# Patient Record
Sex: Female | Born: 1985 | Race: White | Hispanic: No | Marital: Single | Smoking: Current every day smoker
Health system: Southern US, Community
[De-identification: ages and names within clinical notes are randomized; demographics above are authoritative.]

## PROBLEM LIST (undated history)

## (undated) DIAGNOSIS — F209 Schizophrenia, unspecified: Secondary | ICD-10-CM

## (undated) DIAGNOSIS — G43909 Migraine, unspecified, not intractable, without status migrainosus: Secondary | ICD-10-CM

## (undated) DIAGNOSIS — R0602 Shortness of breath: Secondary | ICD-10-CM

## (undated) DIAGNOSIS — F329 Major depressive disorder, single episode, unspecified: Secondary | ICD-10-CM

## (undated) DIAGNOSIS — F32A Depression, unspecified: Secondary | ICD-10-CM

## (undated) DIAGNOSIS — F319 Bipolar disorder, unspecified: Secondary | ICD-10-CM

## (undated) DIAGNOSIS — R51 Headache: Secondary | ICD-10-CM

## (undated) DIAGNOSIS — Z5189 Encounter for other specified aftercare: Secondary | ICD-10-CM

## (undated) DIAGNOSIS — J45909 Unspecified asthma, uncomplicated: Secondary | ICD-10-CM

## (undated) HISTORY — PX: TUBAL LIGATION: SHX77

---
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
## 2008-09-01 ENCOUNTER — Inpatient Hospital Stay (HOSPITAL_COMMUNITY): Admission: AD | Admit: 2008-09-01 | Discharge: 2008-09-01 | Payer: Self-pay | Admitting: Family Medicine

## 2008-09-03 ENCOUNTER — Inpatient Hospital Stay (HOSPITAL_COMMUNITY): Admission: AD | Admit: 2008-09-03 | Discharge: 2008-09-03 | Payer: Self-pay | Admitting: Obstetrics & Gynecology

## 2009-01-26 ENCOUNTER — Inpatient Hospital Stay (HOSPITAL_COMMUNITY): Admission: AD | Admit: 2009-01-26 | Discharge: 2009-01-26 | Payer: Self-pay | Admitting: Obstetrics and Gynecology

## 2009-01-29 ENCOUNTER — Inpatient Hospital Stay (HOSPITAL_COMMUNITY): Admission: AD | Admit: 2009-01-29 | Discharge: 2009-01-29 | Payer: Self-pay | Admitting: Obstetrics and Gynecology

## 2009-02-10 ENCOUNTER — Inpatient Hospital Stay (HOSPITAL_COMMUNITY): Admission: AD | Admit: 2009-02-10 | Discharge: 2009-02-10 | Payer: Self-pay | Admitting: Obstetrics and Gynecology

## 2009-02-21 ENCOUNTER — Ambulatory Visit: Payer: Self-pay | Admitting: Internal Medicine

## 2009-02-21 ENCOUNTER — Inpatient Hospital Stay (HOSPITAL_COMMUNITY): Admission: AD | Admit: 2009-02-21 | Discharge: 2009-02-24 | Payer: Self-pay | Admitting: Obstetrics and Gynecology

## 2009-02-22 ENCOUNTER — Encounter: Payer: Self-pay | Admitting: Internal Medicine

## 2009-03-06 ENCOUNTER — Inpatient Hospital Stay (HOSPITAL_COMMUNITY): Admission: AD | Admit: 2009-03-06 | Discharge: 2009-03-06 | Payer: Self-pay | Admitting: Obstetrics and Gynecology

## 2009-03-08 ENCOUNTER — Inpatient Hospital Stay (HOSPITAL_COMMUNITY): Admission: AD | Admit: 2009-03-08 | Discharge: 2009-03-08 | Payer: Self-pay | Admitting: Obstetrics and Gynecology

## 2009-03-12 ENCOUNTER — Inpatient Hospital Stay (HOSPITAL_COMMUNITY): Admission: AD | Admit: 2009-03-12 | Discharge: 2009-03-12 | Payer: Self-pay | Admitting: Obstetrics and Gynecology

## 2009-03-17 ENCOUNTER — Inpatient Hospital Stay (HOSPITAL_COMMUNITY): Admission: AD | Admit: 2009-03-17 | Discharge: 2009-03-17 | Payer: Self-pay | Admitting: Obstetrics and Gynecology

## 2009-03-21 ENCOUNTER — Inpatient Hospital Stay (HOSPITAL_COMMUNITY): Admission: AD | Admit: 2009-03-21 | Discharge: 2009-03-22 | Payer: Self-pay | Admitting: Obstetrics and Gynecology

## 2009-03-24 ENCOUNTER — Inpatient Hospital Stay (HOSPITAL_COMMUNITY): Admission: AD | Admit: 2009-03-24 | Discharge: 2009-03-29 | Payer: Self-pay | Admitting: Obstetrics and Gynecology

## 2009-03-28 ENCOUNTER — Encounter (INDEPENDENT_AMBULATORY_CARE_PROVIDER_SITE_OTHER): Payer: Self-pay | Admitting: Obstetrics and Gynecology

## 2009-11-06 IMAGING — CR DG CHEST 1V PORT
1 series · 1 of 1 positions shown · non-contrast
Comparison: Portable exam 6566 hours without priors for comparison.

CLINICAL DATA: 37 weeks pregnant, chest pain, shortness of breath

PORTABLE CHEST - 1 VIEW

[view not recorded]
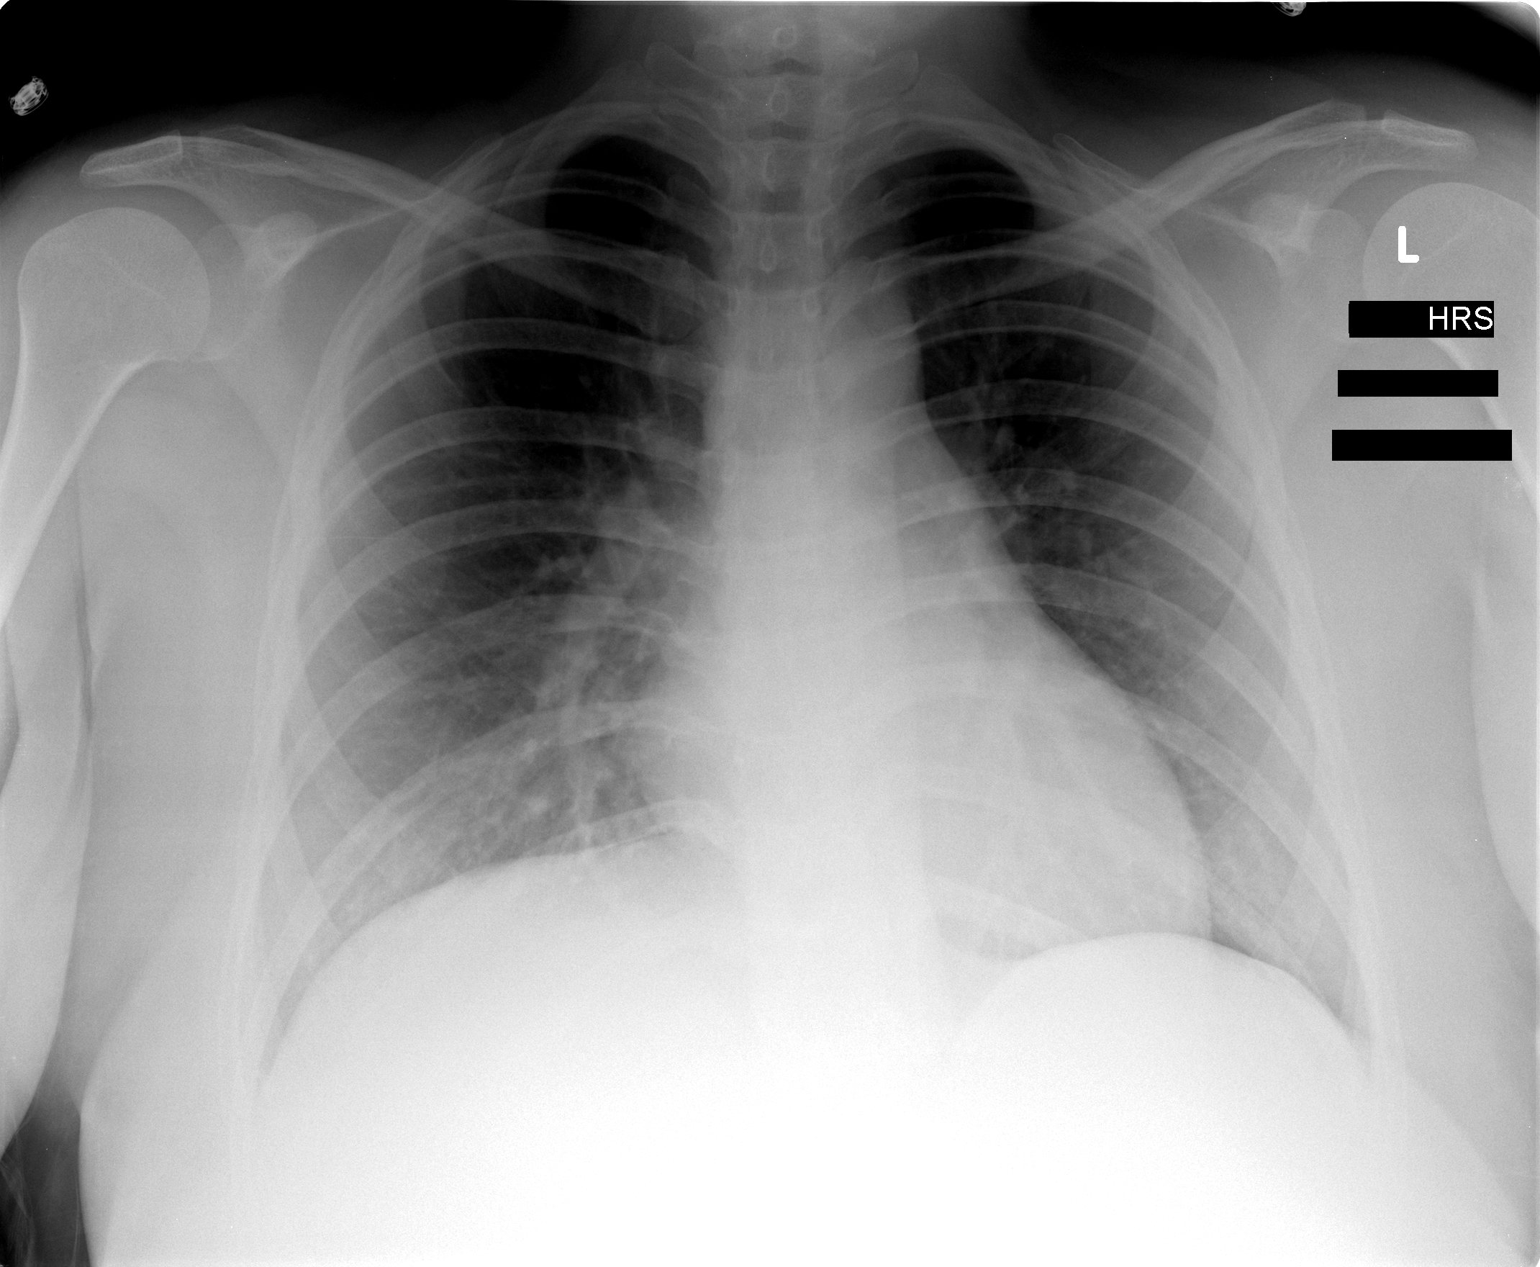

[1 of 1 positions shown; findings below may reference images not displayed]

FINDINGS: Normal heart size, mediastinal contours, and pulmonary vascularity
for pregnancy.
Lungs clear.
No pleural effusion or pneumothorax.
Bones unremarkable.
IMPRESSION: No acute abnormalities.

## 2009-12-24 ENCOUNTER — Emergency Department (HOSPITAL_COMMUNITY): Admission: EM | Admit: 2009-12-24 | Discharge: 2009-12-24 | Payer: Self-pay | Admitting: Emergency Medicine

## 2010-01-12 ENCOUNTER — Emergency Department (HOSPITAL_COMMUNITY): Admission: EM | Admit: 2010-01-12 | Discharge: 2010-01-13 | Payer: Self-pay | Admitting: Emergency Medicine

## 2010-02-14 ENCOUNTER — Emergency Department (HOSPITAL_COMMUNITY): Admission: EM | Admit: 2010-02-14 | Discharge: 2010-02-14 | Payer: Self-pay | Admitting: Emergency Medicine

## 2010-06-02 ENCOUNTER — Emergency Department: Payer: Self-pay | Admitting: Emergency Medicine

## 2010-06-15 ENCOUNTER — Emergency Department (HOSPITAL_COMMUNITY): Admission: EM | Admit: 2010-06-15 | Discharge: 2010-06-16 | Payer: Self-pay | Admitting: Emergency Medicine

## 2010-08-06 ENCOUNTER — Emergency Department (HOSPITAL_COMMUNITY): Admission: EM | Admit: 2010-08-06 | Discharge: 2010-07-02 | Payer: Self-pay | Admitting: Emergency Medicine

## 2010-08-15 ENCOUNTER — Emergency Department (HOSPITAL_COMMUNITY)
Admission: EM | Admit: 2010-08-15 | Discharge: 2010-08-15 | Payer: Self-pay | Source: Home / Self Care | Admitting: Emergency Medicine

## 2010-08-26 ENCOUNTER — Emergency Department (HOSPITAL_COMMUNITY)
Admission: EM | Admit: 2010-08-26 | Discharge: 2010-08-26 | Payer: Self-pay | Source: Home / Self Care | Admitting: Emergency Medicine

## 2010-09-19 ENCOUNTER — Encounter: Payer: Self-pay | Admitting: *Deleted

## 2010-10-15 ENCOUNTER — Emergency Department (HOSPITAL_COMMUNITY)
Admission: EM | Admit: 2010-10-15 | Discharge: 2010-10-15 | Disposition: A | Discharge status: Home / Self Care | Payer: Self-pay | Attending: Emergency Medicine | Admitting: Emergency Medicine

## 2010-10-15 DIAGNOSIS — L298 Other pruritus: Secondary | ICD-10-CM | POA: Insufficient documentation

## 2010-10-15 DIAGNOSIS — R21 Rash and other nonspecific skin eruption: Secondary | ICD-10-CM | POA: Insufficient documentation

## 2010-10-15 DIAGNOSIS — S335XXA Sprain of ligaments of lumbar spine, initial encounter: Secondary | ICD-10-CM | POA: Insufficient documentation

## 2010-10-15 DIAGNOSIS — M545 Low back pain, unspecified: Secondary | ICD-10-CM | POA: Insufficient documentation

## 2010-10-15 DIAGNOSIS — R296 Repeated falls: Secondary | ICD-10-CM | POA: Insufficient documentation

## 2010-10-15 DIAGNOSIS — L2989 Other pruritus: Secondary | ICD-10-CM | POA: Insufficient documentation

## 2010-11-09 ENCOUNTER — Emergency Department (HOSPITAL_COMMUNITY): Payer: Self-pay

## 2010-11-09 ENCOUNTER — Emergency Department (HOSPITAL_COMMUNITY)
Admission: EM | Admit: 2010-11-09 | Discharge: 2010-11-10 | Disposition: A | Discharge status: Home / Self Care | Payer: Self-pay | Attending: Emergency Medicine | Admitting: Emergency Medicine

## 2010-11-09 DIAGNOSIS — R079 Chest pain, unspecified: Secondary | ICD-10-CM | POA: Insufficient documentation

## 2010-11-09 DIAGNOSIS — R059 Cough, unspecified: Secondary | ICD-10-CM | POA: Insufficient documentation

## 2010-11-09 DIAGNOSIS — R05 Cough: Secondary | ICD-10-CM | POA: Insufficient documentation

## 2010-11-09 DIAGNOSIS — J40 Bronchitis, not specified as acute or chronic: Secondary | ICD-10-CM | POA: Insufficient documentation

## 2010-11-09 DIAGNOSIS — R109 Unspecified abdominal pain: Secondary | ICD-10-CM | POA: Insufficient documentation

## 2010-11-09 LAB — DIFFERENTIAL
Basophils Absolute: 0 10*3/uL (ref 0.0–0.1)
Basophils Absolute: 0 10*3/uL (ref 0.0–0.1)
Basophils Relative: 0 % (ref 0–1)
Eosinophils Absolute: 0.4 10*3/uL (ref 0.0–0.7)
Eosinophils Relative: 3 % (ref 0–5)
Eosinophils Relative: 3 % (ref 0–5)
Lymphocytes Relative: 24 % (ref 12–46)
Lymphocytes Relative: 26 % (ref 12–46)
Lymphocytes Relative: 33 % (ref 12–46)
Lymphs Abs: 2.3 10*3/uL (ref 0.7–4.0)
Lymphs Abs: 4.1 10*3/uL — ABNORMAL HIGH (ref 0.7–4.0)
Monocytes Absolute: 0.6 10*3/uL (ref 0.1–1.0)
Monocytes Absolute: 0.6 10*3/uL (ref 0.1–1.0)
Monocytes Absolute: 0.8 10*3/uL (ref 0.1–1.0)
Monocytes Relative: 6 % (ref 3–12)
Monocytes Relative: 7 % (ref 3–12)
Monocytes Relative: 7 % (ref 3–12)
Neutro Abs: 5.6 10*3/uL (ref 1.7–7.7)
Neutro Abs: 6.8 10*3/uL (ref 1.7–7.7)
Neutrophils Relative %: 68 % (ref 43–77)

## 2010-11-09 LAB — CBC
HCT: 42.8 % (ref 36.0–46.0)
HCT: 44.6 % (ref 36.0–46.0)
HCT: 42.4 % (ref 36.0–46.0)
Hemoglobin: 14.8 g/dL (ref 12.0–15.0)
Hemoglobin: 15.4 g/dL — ABNORMAL HIGH (ref 12.0–15.0)
MCH: 32.1 pg (ref 26.0–34.0)
MCHC: 34.5 g/dL (ref 30.0–36.0)
MCHC: 34 g/dL (ref 30.0–36.0)
MCV: 96 fL (ref 78.0–100.0)
MCV: 94.6 fL (ref 78.0–100.0)
Platelets: 249 10*3/uL (ref 150–400)
RDW: 12.6 % (ref 11.5–15.5)
RDW: 12.2 % (ref 11.5–15.5)
WBC: 10.1 10*3/uL (ref 4.0–10.5)
WBC: 8.8 10*3/uL (ref 4.0–10.5)

## 2010-11-09 LAB — BASIC METABOLIC PANEL
BUN: 11 mg/dL (ref 6–23)
Calcium: 9.6 mg/dL (ref 8.4–10.5)
Chloride: 100 mEq/L (ref 96–112)
GFR calc Af Amer: >60 mL/min (ref >60)
GFR calc non Af Amer: >60 mL/min (ref >60)
Glucose, Bld: 104 mg/dL — ABNORMAL HIGH (ref 70–99)
Glucose, Bld: 98 mg/dL (ref 70–99)
Potassium: 3.8 mEq/L (ref 3.5–5.1)
Potassium: 3.9 mEq/L (ref 3.5–5.1)
Sodium: 139 mEq/L (ref 135–145)
Sodium: 141 mEq/L (ref 135–145)

## 2010-11-09 LAB — POCT PREGNANCY, URINE
Preg Test, Ur: NEGATIVE
Preg Test, Ur: NEGATIVE

## 2010-11-09 LAB — HEPATIC FUNCTION PANEL
ALT: 13 U/L (ref 0–35)
AST: 17 U/L (ref 0–37)
Alkaline Phosphatase: 65 U/L (ref 39–117)
Bilirubin, Direct: 0.1 mg/dL (ref 0.0–0.3)
Indirect Bilirubin: 0.3 mg/dL (ref 0.3–0.9)
Total Bilirubin: 0.4 mg/dL (ref 0.3–1.2)

## 2010-11-09 LAB — URINALYSIS, ROUTINE W REFLEX MICROSCOPIC
Bilirubin Urine: NEGATIVE
Glucose, UA: NEGATIVE mg/dL
Hgb urine dipstick: NEGATIVE
Hgb urine dipstick: NEGATIVE
Ketones, ur: NEGATIVE mg/dL
Ketones, ur: NEGATIVE mg/dL
Nitrite: NEGATIVE
Protein, ur: NEGATIVE mg/dL
pH: 6 (ref 5.0–8.0)
pH: 6 (ref 5.0–8.0)

## 2010-11-09 LAB — PREGNANCY, URINE: Preg Test, Ur: NEGATIVE

## 2010-11-09 LAB — POCT I-STAT, CHEM 8
Calcium, Ion: 1.11 mmol/L — ABNORMAL LOW (ref 1.12–1.32)
Chloride: 105 mEq/L (ref 96–112)
Creatinine, Ser: 0.9 mg/dL (ref 0.4–1.2)
Glucose, Bld: 99 mg/dL (ref 70–99)
HCT: 45 % (ref 36.0–46.0)

## 2010-11-09 LAB — WET PREP, GENITAL
Clue Cells Wet Prep HPF POC: NONE SEEN
WBC, Wet Prep HPF POC: NONE SEEN

## 2010-11-16 LAB — COMPREHENSIVE METABOLIC PANEL
ALT: 14 U/L (ref 0–35)
AST: 20 U/L (ref 0–37)
BUN: 9 mg/dL (ref 6–23)
CO2: 24 mEq/L (ref 19–32)
Calcium: 9.1 mg/dL (ref 8.4–10.5)
Chloride: 105 mEq/L (ref 96–112)
GFR calc Af Amer: >60 mL/min (ref >60)
Potassium: 3.7 mEq/L (ref 3.5–5.1)
Total Protein: 7.6 g/dL (ref 6.0–8.3)

## 2010-11-16 LAB — DIFFERENTIAL
Eosinophils Absolute: 0.3 10*3/uL (ref 0.0–0.7)
Lymphocytes Relative: 9 % — ABNORMAL LOW (ref 12–46)
Lymphs Abs: 1.2 10*3/uL (ref 0.7–4.0)
Monocytes Relative: 7 % (ref 3–12)
Neutro Abs: 10.7 10*3/uL — ABNORMAL HIGH (ref 1.7–7.7)
Neutrophils Relative %: 82 % — ABNORMAL HIGH (ref 43–77)

## 2010-11-16 LAB — URINALYSIS, ROUTINE W REFLEX MICROSCOPIC
Bilirubin Urine: NEGATIVE
Hgb urine dipstick: NEGATIVE
Ketones, ur: NEGATIVE mg/dL
Nitrite: NEGATIVE
Protein, ur: NEGATIVE mg/dL
Urobilinogen, UA: 0.2 mg/dL (ref 0.0–1.0)

## 2010-11-16 LAB — CBC
HCT: 43 % (ref 36.0–46.0)
MCV: 93.4 fL (ref 78.0–100.0)
Platelets: 189 10*3/uL (ref 150–400)
RBC: 4.61 MIL/uL (ref 3.87–5.11)
WBC: 13.1 10*3/uL — ABNORMAL HIGH (ref 4.0–10.5)

## 2010-11-16 LAB — URINE MICROSCOPIC-ADD ON

## 2010-11-16 LAB — POCT PREGNANCY, URINE: Preg Test, Ur: NEGATIVE

## 2010-11-17 LAB — PREGNANCY, URINE: Preg Test, Ur: NEGATIVE

## 2010-11-17 LAB — URINE MICROSCOPIC-ADD ON

## 2010-11-17 LAB — CBC
MCHC: 34.5 g/dL (ref 30.0–36.0)
Platelets: 204 10*3/uL (ref 150–400)
RDW: 13.6 % (ref 11.5–15.5)

## 2010-11-17 LAB — COMPREHENSIVE METABOLIC PANEL
ALT: 18 U/L (ref 0–35)
Albumin: 4.1 g/dL (ref 3.5–5.2)
Alkaline Phosphatase: 87 U/L (ref 39–117)
BUN: 7 mg/dL (ref 6–23)
Calcium: 8.9 mg/dL (ref 8.4–10.5)
Potassium: 3.9 mEq/L (ref 3.5–5.1)
Sodium: 139 mEq/L (ref 135–145)
Total Protein: 7.4 g/dL (ref 6.0–8.3)

## 2010-11-17 LAB — URINALYSIS, ROUTINE W REFLEX MICROSCOPIC
Glucose, UA: NEGATIVE mg/dL
Specific Gravity, Urine: 1.003 — ABNORMAL LOW (ref 1.005–1.030)
pH: 7 (ref 5.0–8.0)

## 2010-11-17 LAB — DIFFERENTIAL
Basophils Relative: 1 % (ref 0–1)
Lymphs Abs: 1.7 10*3/uL (ref 0.7–4.0)
Monocytes Absolute: 0.6 10*3/uL (ref 0.1–1.0)
Monocytes Relative: 7 % (ref 3–12)
Neutro Abs: 5.5 10*3/uL (ref 1.7–7.7)

## 2010-12-06 LAB — URINALYSIS, ROUTINE W REFLEX MICROSCOPIC
Bilirubin Urine: NEGATIVE
Bilirubin Urine: NEGATIVE
Bilirubin Urine: NEGATIVE
Ketones, ur: NEGATIVE mg/dL
Nitrite: NEGATIVE
Nitrite: NEGATIVE
Nitrite: NEGATIVE
Specific Gravity, Urine: >1.03 — ABNORMAL HIGH (ref 1.005–1.030)
Specific Gravity, Urine: 1.025 (ref 1.005–1.030)
Urobilinogen, UA: 0.2 mg/dL (ref 0.0–1.0)
Urobilinogen, UA: 1 mg/dL (ref 0.0–1.0)
Urobilinogen, UA: 1 mg/dL (ref 0.0–1.0)
pH: 6 (ref 5.0–8.0)
pH: 6 (ref 5.0–8.0)

## 2010-12-06 LAB — URINE CULTURE: Special Requests: NEGATIVE

## 2010-12-06 LAB — COMPREHENSIVE METABOLIC PANEL
ALT: 21 U/L (ref 0–35)
AST: 24 U/L (ref 0–37)
AST: 20 U/L (ref 0–37)
Alkaline Phosphatase: 128 U/L — ABNORMAL HIGH (ref 39–117)
BUN: 1 mg/dL — ABNORMAL LOW (ref 6–23)
BUN: <1 mg/dL — ABNORMAL LOW (ref 6–23)
CO2: 23 mEq/L (ref 19–32)
CO2: 23 mEq/L (ref 19–32)
CO2: 21 mEq/L (ref 19–32)
Calcium: 8.3 mg/dL — ABNORMAL LOW (ref 8.4–10.5)
Calcium: 8.4 mg/dL (ref 8.4–10.5)
Calcium: 8.7 mg/dL (ref 8.4–10.5)
Chloride: 103 mEq/L (ref 96–112)
Chloride: 107 mEq/L (ref 96–112)
Creatinine, Ser: 0.5 mg/dL (ref 0.4–1.2)
Creatinine, Ser: 0.59 mg/dL (ref 0.4–1.2)
GFR calc Af Amer: >60 mL/min (ref >60)
GFR calc Af Amer: >60 mL/min (ref >60)
GFR calc Af Amer: >60 mL/min (ref >60)
GFR calc non Af Amer: >60 mL/min (ref >60)
GFR calc non Af Amer: >60 mL/min (ref >60)
GFR calc non Af Amer: >60 mL/min (ref >60)
Glucose, Bld: 122 mg/dL — ABNORMAL HIGH (ref 70–99)
Potassium: 4.1 mEq/L (ref 3.5–5.1)
Sodium: 139 mEq/L (ref 135–145)
Total Bilirubin: 0.5 mg/dL (ref 0.3–1.2)
Total Bilirubin: 0.8 mg/dL (ref 0.3–1.2)
Total Protein: 5.8 g/dL — ABNORMAL LOW (ref 6.0–8.3)

## 2010-12-06 LAB — DIFFERENTIAL
Basophils Absolute: 0 10*3/uL (ref 0.0–0.1)
Basophils Absolute: 0 10*3/uL (ref 0.0–0.1)
Basophils Absolute: 0 10*3/uL (ref 0.0–0.1)
Basophils Relative: 0 % (ref 0–1)
Basophils Relative: 0 % (ref 0–1)
Eosinophils Absolute: 0.1 10*3/uL (ref 0.0–0.7)
Eosinophils Relative: 1 % (ref 0–5)
Eosinophils Relative: 1 % (ref 0–5)
Lymphocytes Relative: 12 % (ref 12–46)
Lymphocytes Relative: 21 % (ref 12–46)
Lymphocytes Relative: 22 % (ref 12–46)
Lymphs Abs: 1 10*3/uL (ref 0.7–4.0)
Lymphs Abs: 2 10*3/uL (ref 0.7–4.0)
Monocytes Absolute: 0.8 10*3/uL (ref 0.1–1.0)
Monocytes Relative: 9 % (ref 3–12)
Neutro Abs: 7.3 10*3/uL (ref 1.7–7.7)
Neutro Abs: 6.6 10*3/uL (ref 1.7–7.7)
Neutro Abs: 8 10*3/uL — ABNORMAL HIGH (ref 1.7–7.7)
Neutrophils Relative %: 83 % — ABNORMAL HIGH (ref 43–77)
Neutrophils Relative %: 71 % (ref 43–77)

## 2010-12-06 LAB — CBC
HCT: 25.5 % — ABNORMAL LOW (ref 36.0–46.0)
HCT: 29.8 % — ABNORMAL LOW (ref 36.0–46.0)
HCT: 30.5 % — ABNORMAL LOW (ref 36.0–46.0)
HCT: 34.3 % — ABNORMAL LOW (ref 36.0–46.0)
Hemoglobin: 10.8 g/dL — ABNORMAL LOW (ref 12.0–15.0)
Hemoglobin: 11.6 g/dL — ABNORMAL LOW (ref 12.0–15.0)
MCHC: 33.7 g/dL (ref 30.0–36.0)
MCHC: 33.8 g/dL (ref 30.0–36.0)
MCHC: 34.2 g/dL (ref 30.0–36.0)
MCHC: 34.3 g/dL (ref 30.0–36.0)
MCHC: 34 g/dL (ref 30.0–36.0)
MCHC: 34.1 g/dL (ref 30.0–36.0)
MCHC: 35.1 g/dL (ref 30.0–36.0)
MCV: 89 fL (ref 78.0–100.0)
MCV: 89.1 fL (ref 78.0–100.0)
MCV: 89.2 fL (ref 78.0–100.0)
MCV: 89.4 fL (ref 78.0–100.0)
Platelets: 197 10*3/uL (ref 150–400)
Platelets: 225 10*3/uL (ref 150–400)
Platelets: 199 10*3/uL (ref 150–400)
Platelets: 208 10*3/uL (ref 150–400)
RBC: 3.34 MIL/uL — ABNORMAL LOW (ref 3.87–5.11)
RBC: 3.43 MIL/uL — ABNORMAL LOW (ref 3.87–5.11)
RBC: 3.6 MIL/uL — ABNORMAL LOW (ref 3.87–5.11)
RBC: 3.46 MIL/uL — ABNORMAL LOW (ref 3.87–5.11)
RDW: 13.8 % (ref 11.5–15.5)
RDW: 13.8 % (ref 11.5–15.5)
WBC: 11.9 10*3/uL — ABNORMAL HIGH (ref 4.0–10.5)
WBC: 12.1 10*3/uL — ABNORMAL HIGH (ref 4.0–10.5)
WBC: 13.5 10*3/uL — ABNORMAL HIGH (ref 4.0–10.5)

## 2010-12-06 LAB — LACTATE DEHYDROGENASE: LDH: 143 U/L (ref 94–250)

## 2010-12-06 LAB — CCBB MATERNAL DONOR DRAW

## 2010-12-07 LAB — CBC
HCT: 29.5 % — ABNORMAL LOW (ref 36.0–46.0)
HCT: 32.2 % — ABNORMAL LOW (ref 36.0–46.0)
HCT: 33.6 % — ABNORMAL LOW (ref 36.0–46.0)
Hemoglobin: 10 g/dL — ABNORMAL LOW (ref 12.0–15.0)
Hemoglobin: 10.1 g/dL — ABNORMAL LOW (ref 12.0–15.0)
MCHC: 34 g/dL (ref 30.0–36.0)
MCHC: 34.2 g/dL (ref 30.0–36.0)
MCV: 91.8 fL (ref 78.0–100.0)
MCV: 91.9 fL (ref 78.0–100.0)
MCV: 92.2 fL (ref 78.0–100.0)
MCV: 92.7 fL (ref 78.0–100.0)
MCV: 94.4 fL (ref 78.0–100.0)
Platelets: 173 10*3/uL (ref 150–400)
Platelets: 202 10*3/uL (ref 150–400)
Platelets: 228 10*3/uL (ref 150–400)
RBC: 3.15 MIL/uL — ABNORMAL LOW (ref 3.87–5.11)
RBC: 3.24 MIL/uL — ABNORMAL LOW (ref 3.87–5.11)
RBC: 3.36 MIL/uL — ABNORMAL LOW (ref 3.87–5.11)
RBC: 3.5 MIL/uL — ABNORMAL LOW (ref 3.87–5.11)
RDW: 13.2 % (ref 11.5–15.5)
RDW: 13.3 % (ref 11.5–15.5)
RDW: 12.7 % (ref 11.5–15.5)
WBC: 10.6 10*3/uL — ABNORMAL HIGH (ref 4.0–10.5)
WBC: 7.2 10*3/uL (ref 4.0–10.5)
WBC: 8 10*3/uL (ref 4.0–10.5)

## 2010-12-07 LAB — URINALYSIS, ROUTINE W REFLEX MICROSCOPIC
Bilirubin Urine: NEGATIVE
Glucose, UA: NEGATIVE mg/dL
Hgb urine dipstick: NEGATIVE
Hgb urine dipstick: NEGATIVE
Ketones, ur: NEGATIVE mg/dL
Nitrite: NEGATIVE
Protein, ur: NEGATIVE mg/dL
Protein, ur: NEGATIVE mg/dL
Protein, ur: NEGATIVE mg/dL
Urobilinogen, UA: 1 mg/dL (ref 0.0–1.0)
Urobilinogen, UA: 0.2 mg/dL (ref 0.0–1.0)

## 2010-12-07 LAB — COMPREHENSIVE METABOLIC PANEL
ALT: 72 U/L — ABNORMAL HIGH (ref 0–35)
ALT: 77 U/L — ABNORMAL HIGH (ref 0–35)
AST: 77 U/L — ABNORMAL HIGH (ref 0–37)
AST: 82 U/L — ABNORMAL HIGH (ref 0–37)
AST: 84 U/L — ABNORMAL HIGH (ref 0–37)
Albumin: 2.7 g/dL — ABNORMAL LOW (ref 3.5–5.2)
Alkaline Phosphatase: 134 U/L — ABNORMAL HIGH (ref 39–117)
BUN: 4 mg/dL — ABNORMAL LOW (ref 6–23)
BUN: 5 mg/dL — ABNORMAL LOW (ref 6–23)
CO2: 23 mEq/L (ref 19–32)
CO2: 24 mEq/L (ref 19–32)
CO2: 25 mEq/L (ref 19–32)
CO2: 25 mEq/L (ref 19–32)
Calcium: 8.5 mg/dL (ref 8.4–10.5)
Chloride: 104 mEq/L (ref 96–112)
Chloride: 105 mEq/L (ref 96–112)
Chloride: 106 mEq/L (ref 96–112)
Chloride: 107 mEq/L (ref 96–112)
Creatinine, Ser: 0.46 mg/dL (ref 0.4–1.2)
Creatinine, Ser: 0.49 mg/dL (ref 0.4–1.2)
Creatinine, Ser: 0.51 mg/dL (ref 0.4–1.2)
Creatinine, Ser: 0.41 mg/dL (ref 0.4–1.2)
GFR calc Af Amer: >60 mL/min (ref >60)
GFR calc Af Amer: >60 mL/min (ref >60)
GFR calc Af Amer: >60 mL/min (ref >60)
GFR calc non Af Amer: >60 mL/min (ref >60)
GFR calc non Af Amer: >60 mL/min (ref >60)
GFR calc non Af Amer: >60 mL/min (ref >60)
GFR calc non Af Amer: >60 mL/min (ref >60)
Glucose, Bld: 114 mg/dL — ABNORMAL HIGH (ref 70–99)
Glucose, Bld: 85 mg/dL (ref 70–99)
Potassium: 3.7 mEq/L (ref 3.5–5.1)
Sodium: 135 mEq/L (ref 135–145)
Sodium: 136 mEq/L (ref 135–145)
Total Bilirubin: 0.8 mg/dL (ref 0.3–1.2)
Total Bilirubin: 0.8 mg/dL (ref 0.3–1.2)
Total Bilirubin: 1 mg/dL (ref 0.3–1.2)
Total Bilirubin: 0.1 mg/dL — ABNORMAL LOW (ref 0.3–1.2)
Total Protein: 6.2 g/dL (ref 6.0–8.3)
Total Protein: 6.2 g/dL (ref 6.0–8.3)

## 2010-12-07 LAB — DIFFERENTIAL
Basophils Absolute: 0 10*3/uL (ref 0.0–0.1)
Eosinophils Relative: 2 % (ref 0–5)
Lymphocytes Relative: 13 % (ref 12–46)
Neutro Abs: 8.2 10*3/uL — ABNORMAL HIGH (ref 1.7–7.7)

## 2010-12-07 LAB — LACTATE DEHYDROGENASE: LDH: 153 U/L (ref 94–250)

## 2010-12-07 LAB — HEPATIC FUNCTION PANEL
AST: 87 U/L — ABNORMAL HIGH (ref 0–37)
Albumin: 2.3 g/dL — ABNORMAL LOW (ref 3.5–5.2)
Alkaline Phosphatase: 141 U/L — ABNORMAL HIGH (ref 39–117)
Total Bilirubin: 0.7 mg/dL (ref 0.3–1.2)
Total Protein: 5.5 g/dL — ABNORMAL LOW (ref 6.0–8.3)

## 2010-12-07 LAB — AMYLASE: Amylase: 74 U/L (ref 27–131)

## 2010-12-07 LAB — URINALYSIS, DIPSTICK ONLY
Glucose, UA: NEGATIVE mg/dL
Ketones, ur: 40 mg/dL — AB
Leukocytes, UA: NEGATIVE
Nitrite: NEGATIVE
Specific Gravity, Urine: <1.005 — ABNORMAL LOW (ref 1.005–1.030)
pH: 6 (ref 5.0–8.0)

## 2010-12-07 LAB — LIPASE, BLOOD
Lipase: 26 U/L (ref 11–59)
Lipase: 26 U/L (ref 11–59)

## 2010-12-07 LAB — PROTIME-INR: Prothrombin Time: 13.2 seconds (ref 11.6–15.2)

## 2010-12-08 LAB — URINALYSIS, ROUTINE W REFLEX MICROSCOPIC
Ketones, ur: 15 mg/dL — AB
Nitrite: NEGATIVE
Protein, ur: NEGATIVE mg/dL
Urobilinogen, UA: 0.2 mg/dL (ref 0.0–1.0)
pH: 6 (ref 5.0–8.0)

## 2010-12-08 LAB — GC/CHLAMYDIA PROBE AMP, GENITAL
Chlamydia, DNA Probe: NEGATIVE
GC Probe Amp, Genital: NEGATIVE

## 2010-12-08 LAB — WET PREP, GENITAL: Clue Cells Wet Prep HPF POC: NONE SEEN

## 2010-12-08 LAB — STREP B DNA PROBE: Strep Group B Ag: NEGATIVE

## 2010-12-14 LAB — URINALYSIS, ROUTINE W REFLEX MICROSCOPIC
Glucose, UA: NEGATIVE mg/dL
Ketones, ur: 40 mg/dL — AB
Ketones, ur: NEGATIVE mg/dL
Nitrite: NEGATIVE
Protein, ur: 30 mg/dL — AB
Protein, ur: NEGATIVE mg/dL
Urobilinogen, UA: 0.2 mg/dL (ref 0.0–1.0)

## 2010-12-14 LAB — CBC
MCHC: 34.1 g/dL (ref 30.0–36.0)
MCV: 97.7 fL (ref 78.0–100.0)
RBC: 4.28 MIL/uL (ref 3.87–5.11)
RDW: 12.6 % (ref 11.5–15.5)

## 2010-12-14 LAB — POCT PREGNANCY, URINE: Preg Test, Ur: POSITIVE

## 2010-12-14 LAB — WET PREP, GENITAL

## 2010-12-14 LAB — GC/CHLAMYDIA PROBE AMP, GENITAL: Chlamydia, DNA Probe: NEGATIVE

## 2010-12-14 LAB — URINE CULTURE

## 2010-12-14 LAB — URINE MICROSCOPIC-ADD ON

## 2010-12-14 LAB — HCG, QUANTITATIVE, PREGNANCY: hCG, Beta Chain, Quant, S: 94233 m[IU]/mL — ABNORMAL HIGH (ref <5)

## 2011-01-12 NOTE — Discharge Summary (Signed)
NAME:  Joyce Klein, Joyce Klein              ACCOUNT NO.:  [id number]   MEDICAL RECORD NO.:  [id number]          PATIENT TYPE:  INP   LOCATION:  9157                          FACILITY:  [facility]   PHYSICIAN:  Janine Limbo, M.D.DATE OF BIRTH:  March 24, 1986   DATE OF ADMISSION:  02/21/2009  DATE OF DISCHARGE:  02/24/2009                               DISCHARGE SUMMARY   A 25 year old gravida 6, para 1-0-4-1, presented to the [facility]  on June 25 complaining of pain and hyperemesis.   ADMISSION DIAGNOSES:  1. Intrauterine pregnancy at 33-1/7.  2. History of depression with bipolar disease and attempt to suicide.      No medications at present.  3. History of drug abuse, marijuana and cocaine in the past.  4. History of postpartum depression with blood transfusions with her      last pregnancy.  5. Gastroesophageal reflux disease.  6. Migraines.  7. Increased body mass index.  8. Increased liver function.  9. Cholelithiasis.  10.Choledocholithiasis.   DISCHARGE DIAGNOSES:  1. Intrauterine pregnancy at 33 and 4.  2. History of depression.  3. History of drug use.  4. History of postpartum transfusion.  5. Gastroesophageal reflux disease.  6. Migraines.  7. Increased body mass index.  8. Cholelithiasis.   LABORATORY DATA:  She is O positive.  Rubella equivocal.  Her labs were  WBC on June 27 of 6.6, hemoglobin 10.6, hematocrit 30.8, platelets 202.  Amylase is 75, lipase is 20, SGOT is 82, and SGPT is 72.  Sodium 135 and  potassium is 3.7 and all other labs now with the specialty.  On June 28,  hemoglobin was 10.6, 11.1, hematocrit 32.2, platelets 195.  Amylase was  81, lipase was 27, SGOT was 106, and SGPT was 74.   PROCEDURES:  1. Ultrasound.  2. Nonstress test.  3. Betamethasone x2.  4. Endoscopic retrograde cholangiopancreatography at [facility] on      April 26 for stone removal and sphincterectomy.   Consults with Dr. Gerrit Friends who is a general surgeon, consult was with  GI,  Dr. Stan Head.   PLAN:  Not feeling contractions.  Hemoglobin was as reported.  Her  cervix was long, closed, thick, and ballotable.  Contractions on  monitor, 140-141 baseline accelerations noted, contractions every 5  minutes, 30 seconds duration, voided x2.  Intrauterine pregnancy at 70  with cholelithiasis.  Dr. Normand Sloop was notified in person due to some  contractions she did not feel.  If she can keep her lunch down, she can  be discharged home.  She is to schedule for nonstress test 2 times a  week.  Medications, she was given Vicodin 1 p.o., Phenergan 12.5 p.o.  and suppositories 25, Protonix and Tylenol 100 mg.  She is to continue a  low-fat diet and Dr. Gerrit Friends suggested a 6- to 8-week postpartum  cholecystectomy as needed and she is to continue her prenatal care at  the office.      Jasmine Awe, CNM      Janine Limbo, M.D.  Electronically Signed    JM/MEDQ  D:  02/24/2009  T:  02/25/2009  Job:  [id number]
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
## 2011-01-12 NOTE — Op Note (Signed)
NAME:  JAZZMA, NEIDHARDT              ACCOUNT NO.:  [id number]   MEDICAL RECORD NO.:  [id number]          PATIENT TYPE:  INP   LOCATION:  9158                          FACILITY:  [facility]   PHYSICIAN:  Janine Limbo, M.D.DATE OF BIRTH:  [birth year]   DATE OF PROCEDURE:  03/25/2009  DATE OF DISCHARGE:                               OPERATIVE REPORT   PREOPERATIVE DIAGNOSES:  1. A 37-week and 4-day gestation.  2. Cholelithiasis.  3. Abdominal pain.  4. Nausea.   POSTOPERATIVE DIAGNOSES:  1. A 37-week and 4-day gestation.  2. Cholelithiasis.  3. Abdominal pain.  4. Nausea.   PROCEDURE:  Amniocentesis for maturity studies.   SURGEON:  Janine Limbo, MD   ANESTHESIA:  Local, 1% Xylocaine.   DISPOSITION:  Ms. Tipping is a 25 year old female, gravida 6, para 1-0-4-  1, who presents at 54 weeks and 3 days' gestation on March 24, 2009.  This pregnancy has been followed at the [facility].  The patient has  a history of symptomatic cholelithiasis.  A cholecystectomy is planned  after delivery.  The patient has severe abdominal pain and has required  IV Dilaudid.  We are considering amniocentesis for maturity studies at  this time with the anticipation of induction of labor.  If the fluid and  disease are mature, then we will proceed with delivery on March 26, 2009.  The patient understands the indications for her surgical procedure and  she accepts the risks of, but not limited to, anesthetic complications,  bleeding, infections, and possible damage to the surrounding organs.   PROCEDURE IN DETAIL:  The patient was evaluated in her room in the  antenatal unit.  The procedure was discussed and the risk and benefits  were reviewed.  The operative permit was signed.  The nonstress test was  reactive.  An ultrasound was performed and the patient was noted to have  a single intrauterine gestation in a cephalic  presentation.  The  amniotic fluid volume appeared normal.  The placenta was anterior and  was a grade 2.  The patient's blood type is O+.  The amniotic fluid was  noted to be clear.  The patient's abdomen was prepped with multiple  layers of Betadine and then sterilely draped.  Xylocaine 1% of 3 L were  injected directly into the skin of the abdomen at the spot identified as  appropriate to obtain the amniotic fluid by ultrasound.  The spinal  needle was inserted and a total of 17 mL of amniotic fluid were removed.  The needle was removed and the ultrasound was performed once again.  Again, the amniotic fluid volume was noted to be normal.  There was no  evidence of bleeding.  A single intrauterine gestation was noted in a  cephalic presentation.  There was no apparent harm.  The patient was  placed back on the monitor for monitoring.  The fetal heart motions were  noted to be stable.   FOLLOWUP INSTRUCTIONS:  The patient will remain on the antenatal unit  for now.  The fluid will be sent to [facility]  for an L/S ratio and also PG.  If the indices are mature, then we will  proceed with induction of labor.  If the indices are immature, then we  will continue to observe in the hospital.  We will continue the  patient's IV Dilaudid for now for pain management.       Janine Limbo, M.D.  Electronically Signed     AVS/MEDQ  D:  03/25/2009  T:  03/26/2009  Job:  [id number]

## 2011-01-12 NOTE — Op Note (Signed)
NAME:  Joyce Klein, Joyce Klein              ACCOUNT NO.:  [id number]   MEDICAL RECORD NO.:  [id number]          PATIENT TYPE:  INP   LOCATION:  9119                          FACILITY:  [facility]   PHYSICIAN:  Naima A. Dillard, M.D. DATE OF BIRTH:  Apr 11, 1986   DATE OF PROCEDURE:  03/28/2009  DATE OF DISCHARGE:                               OPERATIVE REPORT   PREOPERATIVE DIAGNOSIS:  Multiparity, desires sterilization.   POSTOPERATIVE DIAGNOSIS:  Multiparity, desires sterilization.   PROCEDURE:  Postpartum tubal ligation.   SURGEON:  Naima A. Dillard, MD   ANESTHESIA:  Epidural.   ESTIMATED BLOOD LOSS:  Minimal.   COMPLICATIONS:  None.   FINDINGS:  Normal tubes bilaterally.  A portion of fallopian tubes sent  to Pathology before the procedure and also during her antepartum care.  The patient was counseled on the risk of tubal ligation being bleeding,  infection, damage to internal organs such as bowel and bladder, also a  failure rate of about 1 in 200 and that 50% include result in ectopic  pregnancy, so should she will become pregnant.  She needs to consult the  physician right away.  She also understood that there is a chance of  having regret when having a tubal ligation before the age of 68 and all  birth control was reviewed with the patient.   The patient was taken to the operating room where epidural anesthesia  was found to be adequate.  A 10 mL Marcaine was placed at the  infraumbilical fold, incision was made with a scalpel and carried down  to the fascia using the knife and Mayo scissors.  The patient was  identified, tented up sharply using Mayo scissors and extended  bilaterally.  The patient's left fallopian tube was identified, grasped  with Babcock clamps, followed out to the fimbriated end and the mid  isthmic portion of the tube was ligated with 2-0 plain and excised.  A  Bovie cautery was used to obtain hemostasis.  The tube was returned to  the abdomen.  The patient's  right fallopian tube was grasped in a  similar manner with Babcock clamps, followed up to the fimbriated end  and about a 2-cm segment of the mid isthmic portion of the tube was  excised and ligated with 2-0 plain.  Hemostasis was assured.  The tube  was returned to the abdomen.  All instruments were removed from the  abdomen.  The fascia was closed with 0 Vicryl in a running fashion.  The  skin was closed with 3-0 Monocryl in a subcuticular fashion.  Sponge,  lap and needle counts were correct.  The patient went to recovery room  in stable condition.     Naima A. Normand Sloop, M.D.  Electronically Signed    NAD/MEDQ  D:  03/28/2009  T:  03/29/2009  Job:  [id number]

## 2011-01-12 NOTE — Consult Note (Signed)
NAME:  Joyce Klein, Joyce Klein              ACCOUNT NO.:  [id number]   MEDICAL RECORD NO.:  [id number]          PATIENT TYPE:  INP   LOCATION:  9157                          [facility]ILITY:  [facility]   PHYSICIAN:  Velora Heckler, MD      DATE OF BIRTH:  1986-03-18   DATE OF CONSULTATION:  02/22/2009  DATE OF DISCHARGE:                                 CONSULTATION   REFERRING PHYSICIAN:  Dr. Dierdre Forth.   GASTROENTEROLOGIST:  Dr. Stan Head.   REASON FOR CONSULTATION:  Symptomatic cholelithiasis,  choledocholithiasis.   HISTORY OF PRESENT ILLNESS:  Joyce Klein is a 25 year old white  female who is at approximately 28 weeks estimated gestational age with  her first pregnancy.  She has had several visits to the maternity  admissions unit for abdominal pain which has been relieved quickly with  analgesics.  However, her visit on June 25th was notable for elevated  liver function test on laboratory studies.  Ultrasound examination  showed a distended gallbladder containing multiple gallstones.  Common  bile duct was dilated at 9.6-mm.  Two stones were tentatively identified  within the common bile duct.  General surgery was consulted by Dr.  Pennie Rushing.  Gastroenterology was also called to see and evaluate the  patient.  The patient was seen by Dr. Stan Head and scheduled for  ERCP which is being performed today, June 26th, at [facility].  The patient is seen here at [facility] in  consultation regarding cholelithiasis and possible need for surgical  intervention.   PAST MEDICAL HISTORY:  1. History of bipolar disorder.  2. History of migraine headache.   MEDICATIONS:  Phenergan, Protonix, Tylenol p.r.n.   ALLERGIES:  No known drug allergies.   SOCIAL HISTORY:  The patient is married and unemployed.  She admits to  alcohol and drug use.  She actually has a previous child age 77.   Fifteen system review documented in the medical record without  significant other findings except as noted above.   EXAM:  GENERAL:  A 25 year old gravid white female on a stretcher in the  holding area [employer].  Temperature 97.3, blood  pressure 100/80, respirations 18, pulse 88.  HEENT:  Shows her to be  normocephalic, atraumatic.  Sclerae clear.  Conjunctiva clear.  Pupils  equal and reactive.  Dentition good.  Mucous membranes moist.  Voice  normal.  Palpation of the neck shows no thyroid nodularity, no  lymphadenopathy, no mass, no tenderness.  LUNGS:  Clear to auscultation bilaterally.  CARDIAC:  Shows a regular rate and rhythm without significant murmur.  Peripheral pulses are full.  ABDOMEN:  Gravid, at near term.  No fetal movements are noted on  palpation.  No tenderness.  Bowel sounds are present.  EXTREMITIES:  Nontender.  Edema 1+ in the lower extremities.  NEUROLOGICALLY:  The patient is alert and oriented without focal  deficit.   LABORATORY STUDIES:  White count 7.9, hemoglobin 10.0, platelet count  173,000.  Liver function tests show a total bilirubin of 0.7, alkaline  phosphatase 141, SGOT 87,  SGPT 69, lipase is normal at 26.  Amylase is  normal at 74.  Prothrombin time 13.2 with an INR of 1.0.   RADIOGRAPHIC STUDIES:  Ultrasound results as noted above.   IMPRESSION:  1. Cholelithiasis.  2. Choledocholithiasis.  3. Near term pregnancy.   PLAN:  I discussed the case with Dr. Stan Head.  I agree with the  plan for ERCP, sphincterotomy, and stone extraction.  If this is  successful and the patient clinically improves and remains asymptomatic,  I would like to let her carry her pregnancy to term and then proceed  with cholecystectomy approximately 6-8 weeks after delivery.  If ERCP is  unsuccessful or if the patient has persistent disabling symptoms, it may  be that the pregnancy will need to be terminated with cesarean section  and cholecystectomy performed at an earlier date with possible common  bile  duct exploration.   We will await the results of ERCP today.  General Surgery will continue  to follow.      Velora Heckler, MD  Electronically Signed     TMG/MEDQ  D:  02/22/2009  T:  02/22/2009  Job:  [id number]   cc:   Hal Morales, M.D.  Fax: [phone]   Iva Boop, MD,FACG  [facility]  [address] [place], [place] [address]

## 2011-01-12 NOTE — Discharge Summary (Signed)
NAME:  Joyce Klein, Joyce Klein              ACCOUNT NO.:  [id number]   MEDICAL RECORD NO.:  [id number]          PATIENT TYPE:  INP   LOCATION:  9157                          [facility]  [facility]   PHYSICIAN:  Hal Morales, M.D.DATE OF BIRTH:  October 31, 1985   DATE OF ADMISSION:  02/21/2009  DATE OF DISCHARGE:  02/22/2009                               DISCHARGE SUMMARY   ADMITTING DIAGNOSES:  1. Intrauterine pregnancy at 88 and 1/7 weeks.  2. Gastroesophageal reflux disease and hyperemesis.  3. Cholelithiasis.   PROCEDURE:  Endoscopic retrograde cholangiopancreatography on February 22, 2009, by Dr. Leone Payor.   HOSPITAL COURSE:  Joyce Klein is a 25 year old gravida 6, para 1-0-4-1 at  5 and 1/7 weeks, who presented complaining of severe nausea and  vomiting on the morning of February 21, 2009.  She had had 1 previous  episode of nausea and vomiting that was attributed to probable reflux.  She was taking Protonix and Phenergan and Zofran at home.  However,  these meds were not effective on the day of this event.  She has some  upper abdominal pain.  She denied any contractions, dysuria, bleeding,  or any other lower abdominal pain.  Her pregnancy had been remarkable  for.  1. Four SABs.  2. Bipolar and depression.  3. History of postpartum hemorrhage after 05 pregnancy, required      transfusion.  4. History of cocaine and marijuana use prior to pregnancy.  5. History of migraines.  6. History of suicide attempt.  7. Piercings.  8. Severe gastroesophageal reflux disease and hyperemesis.   While in Maternity Admissions Unit, the patient was noted to have a  reactive fetal heart rate tracing, no D cells, and cervix was closed.  Comprehensive metabolic panel showed SGOT 106, SGPT of 74, uric acid of  3.3, normal amylase, lipase, and LDH.  Glucose was 114, BUN was 4, and  creatinine 0.51.  Clean-catch urine was negative.  She had a gallbladder  ultrasound in Maternity Admissions Unit showing distention  of the  gallbladder wall and multiple mobile small gallstones, the common bile  duct was also distended and then possibly 2 stones within the proximal  aspect of the common bile duct.  Liver, spleen, and kidneys were normal.  Pancreas was not visualized.  There was some gas noted in the bowel.  The patient was maintained n.p.o.  Dr. Pennie Rushing saw the patient.  The  patient was then admitted to allow for general surgical evaluation.  GI  consult was also called.  Dr. Darnell Level and Dr. Darrick Grinder from [facility]  saw the patient.  The plan was made to do an ERCP on February 22, 2009.  The  patient was maintained n.p.o. and was taken to [facility] on  the morning of February 22, 2009, for the ERCP.  The patient was monitored.  While she was in this the procedure, fetal heart remained reassuring.  ERCP was successful in removing a stone, common bile duct.  The patient  began to improve subsequent to that.  By the evening of February 22, 2009,  she was able  to take clear liquids without nausea.  Her pain was  improved.  By the morning of February 23, 2009, her hemoglobin was 10, and  white blood cell count was 7.2.  It had previously been 10.6, platelet  count was 180, SGOT was 82, and SGPT was 72.  Other CMP values were  normal.  Amylase and lipase were also normal with no evidence of  pancreatitis.  Dr. Gerrit Friends and Dr. Darrick Grinder saw the patient on the morning  of February 23, 2009, they felt she was able to be discharged home.  With a  plan made for postpartum lap cholecystectomy and it was treated for one  urgently if she develop cholecystitis.  The patient then tolerate a  regular low-fat diet for lunch.  Dr. Pennie Rushing was consulted and the  decision was made to discharge the patient home.  She had a reactive  tracing just prior to discharge.  The patient was complaining of just  some general muscle soreness and soreness in the upper part of her  abdomen.  She was taking some Tylenol, but felt like she would like  to  have a limited prescription of Vicodin for the next couple days, this  was given, and the patient was discharged home in good condition.   DISCHARGE INSTRUCTIONS:  The patient is to maintain a low-fat diet.  She  will also call us with any increasing pain, nausea, vomiting, fever, or  any other complications.  She also recalls for any obstetrical issues  such as decreased fetal movement, uterine contractions are painful, and  any other problems.   DISCHARGE MEDICATIONS:  1. Vicodin 1 p.o. q.4-6 h p.r.n. pain, dispensed #20, with no refills.  2. The patient will continue her prenatal vitamin.  3. She will also continue her Protonix 40 mg p.o. daily as needed at      home.   DISCHARGE FOLLOWUP:  Will occur on Thursday of this week with [facility] and then after delivery with the general surgeon for  planning the postpartum cholecystectomy.       Renaldo Reel Emilee Hero, C.N.M.      Hal Morales, M.D.  Electronically Signed    VLL/MEDQ  D:  02/23/2009  T:  02/23/2009  Job:  [id number]

## 2011-01-12 NOTE — Discharge Summary (Signed)
NAME:  Joyce Klein, Joyce Klein              ACCOUNT NO.:  [id number]   MEDICAL RECORD NO.:  [id number]           PATIENT TYPE:   LOCATION:                                 FACILITY:   PHYSICIAN:  Vicki L. Latham, C.N.M.DATE OF BIRTH:  02-May-1986   DATE OF ADMISSION:  02/21/2009  DATE OF DISCHARGE:  02/23/2009                               DISCHARGE SUMMARY   ADDENDUM   Prior to discharge, the patient advises that she would like to have a  tubal ligation.  Dr. Pennie Rushing went in and spoke to the patient regarding  this request in light of her age of 89.  The patient advises that in  light of her difficult pregnancy, history of several miscarriages, her  history of bipolar disease, and depression, that the patient would like  to have her tubes tied.  She understands the risks and benefits of this  procedure including failure of method, as well as permanence of method.  The patient still wishes to proceed with making a plan for tubal  ligation.  Medicaid consent form was signed by the patient today because  of February 23, 2009, and a copy will be sent to the operating room at  [facility], as well as [facility].      Renaldo Reel Emilee Hero, C.N.M.     VLL/MEDQ  D:  02/23/2009  T:  02/23/2009  Job:  [id number]

## 2011-01-12 NOTE — Discharge Summary (Signed)
NAME:  Meine, Sybilla              ACCOUNT NO.:  [id number]   MEDICAL RECORD NO.:  [id number]          PATIENT TYPE:  INP   LOCATION:  9119                          [facility]   PHYSICIAN:  Naima A. Dillard, M.D. DATE OF BIRTH:  1986-01-12   DATE OF ADMISSION:  03/24/2009  DATE OF DISCHARGE:  03/29/2009                               DISCHARGE SUMMARY   ADMISSION DIAGNOSES:  1. Intrauterine pregnancy at 37-3/7 weeks' gestation.  2. Cholelithiasis.  3. Abdominal pain.   DISCHARGE DIAGNOSES:  1. Intrauterine pregnancy at 37-6/7 weeks, delivered.  2. Cholelithiasis.   PROCEDURES:  1. Amniocentesis for fetal lung maturity.  2. Postpartum bilateral tubal ligation.   HOSPITAL COURSE:  Ms. Scardina is a 25 year old gravida 6, para 2-0-4-2,  who is admitted at 37-3/7th weeks' gestation with complaints of  worsening abdominal pain which is felt to be due to cholelithiasis as  well as nausea and vomiting.  The patient's pregnancy remarkable for:  1. Late to care  2. Spontaneous AB x4.  3. History of depression and bipolar disorder with suicide attempt.  4. History of drug use including marijuana and cocaine.  5. History of postpartum hemorrhage with transfusion in the past.  6. Gastroesophageal reflux.  7. Migraines.  8. History of tobacco use.  9. First trimester spotting and bacterial vaginosis.  10.Increased BMI.  11.Cholelithiasis.  12.Polyhydramnios.   Upon admission, the patient with complaints of worsening pain after  eating soft fat food.  NST was reactive.  The patient was given IV  fluids and pain medications as well as Phenergan for nausea upon  presentation.  The patient continued to complain with complaints of  chest pain and pain despite pain medications.  At that time, the patient  was admitted for 22 hours of observation and antiemetics as well as pain  management.  A chest x-ray was obtained which revealed no abnormalities.  Lab work on admission with normal  liver functions at that point in time  as far as SGOT and SGPT.  ALP alkaline phosphatase was elevated but  stable.  On hospital day #1, risks, benefits, and alternatives of  amniocentesis for fetal lung maturity discussed with the patient and as  the fetal lungs were mature, then an option of proceeding to induction  of labor.  The patient elected to proceed with amniocentesis.   On March 25, 2009, amniocentesis was performed for fetal lung maturity  which returned with a value of 4.21 with PG indicating fetal lung  maturity.  At that point in time, the patient elected to began induction  of labor.  The patient was transferred to birthing suites at which time  Cytotec was planned for cervical ripening.   On hospital day #2, the patient with no nausea, vomiting, requesting  food and reports her pain was not helped by the PCA that had been  started for her.  Fetal heart rate remained overall within normal limits  with variability present and no decelerations present.  Cervix at that  time 2 cm and 60% effaced, and her second dose of Cytotec was placed at  that time.  Later on hospital day #2, Pitocin was started for induction  of labor.  On hospital day #3, the patient had developed a rash after  Ambien which resolved with Benadryl.  Cervix 3 cm at that point in time.  The patient did obtain epidural anesthesia.  The patient's labor  continued to progress at that point in time and the patient underwent  artificial rupture of membranes with clear fluid.  IUPC was placed in  order to titrate Pitocin.   On March 27, 2009, at 10:55 a.m. the patient had a spontaneous vaginal  delivery of a viable female infant Layana.  Apgars were 8 and 9.  Weight  8 pounds 1 ounce.  Delivery was without complications.  Estimated blood  loss approximately 300 mLs.  The patient at that time again confirmed  her desire for postpartum tubal ligation.   On March 28, 2009, on postpartum day #1, the patient  underwent bilateral  tubal ligation after risks, benefits, and alternatives were again  discussed with the patient.  Surgery was uncomplicated and minimal blood  loss.  On postpartum day #1, the patient's hemoglobin noted to be 8.7.  The patient was ambulating, voiding without difficulty at that time as  well.   On postpartum day #2, postoperative day #1, the patient was ambulating,  voiding, and tolerating solids and liquids without difficulty.  The  patient reports passing flatus.  The patient denies any weakness or  dizziness at that time.  The patient with no headache, vision changes,  or right upper quadrant pain and the patient with no further emesis as  well as decreased abdominal pain at this point in time.  It was felt  that the patient had received full benefit of her hospital stay and was  discharged home. The patient is scheduled to follow up with GI and for  cholecystectomy at the end of August 2010.   DISCHARGE INSTRUCTIONS:  Per [facility] handout.   DISCHARGE MEDICATIONS:  1. Motrin 600 mg p.o. q.6 h. p.r.n. pain.  2. Darvocet N-100 1-2 tablets every 4-6 hours p.r.n. pain, this is per      patient request since she states she is allergic to Percocet.  3. Iron supplement Integra Plus 1 tablet daily.   Discharge followup will occur at [facility] in 6 weeks.      Rhona Leavens, CNM      Naima A. Normand Sloop, M.D.  Electronically Signed    NOS/MEDQ  D:  03/29/2009  T:  03/30/2009  Job:  [id number]

## 2011-01-15 NOTE — Discharge Summary (Signed)
NAME:  Joyce, Klein                      ACCOUNT NO.:  [id number]   MEDICAL RECORD NO.:  [id number]                   PATIENT TYPE:  INP   LOCATION:  9127                                 [facility]   PHYSICIAN:  Naima A. Dillard, M.D.              DATE OF BIRTH:  03/19/1986   DATE OF ADMISSION:  01/12/2004  DATE OF DISCHARGE:                                 DISCHARGE SUMMARY   ADMITTING DIAGNOSIS:  Intrauterine pregnancy at term in early active labor.   PROCEDURE:  Normal spontaneous vaginal delivery.   DISCHARGE DIAGNOSES:  1. Intrauterine pregnancy at term, delivered.  2. Meconium-stained amniotic fluid.  3. Normal spontaneous vaginal delivery.  4. Postpartum hemorrhage.  5. Blood transfusion.   [name] is a 25 year old gravida 1 para 0 who presented at 40 and four-  sevenths weeks in early active labor.  Her labor progressed normally.  She  received an epidural for pain management.  She became completely dilated and  the baby's fetal heart rate remained reassuring.  She pushed for 2 hours 40  minutes to spontaneous vaginal delivery.  She gave birth to a viable female  infant named Georges Mouse, weight 8 pounds 10 ounces, with Apgar scores  of 6 at one minute and 8 at five minutes, and a cord pH of 7.36.  The NICU  team was in attendance secondary to meconium-stained amniotic fluid.  The  baby has done well in the postpartum period and is bottle feeding.  Following delivery the patient's fundus became boggy.  She was noted to pass  a large 200-300 mL clot.  Her fundus became firm with fundal massage.  The  uterus had no further clots.  She was given Methergine IM and fundus  remained firm with no further excessive bleeding.  However, the patient  became symptomatic, very pale, lightheaded.  She received a bolus of IV  fluids.  A CBC was checked which was found to be 6.6.  In light of  postpartum hemorrhage and severe decrease in hemoglobin as well as  symptomatic dizziness and lightheadedness, the patient agreed to blood  transfusion following review of the risks and benefits.  She received 2  units of packed red blood cells and did quite well following administration.  Her hemoglobin came up to 7.9 and on postpartum day #1 was 7.2.  Her vital  signs have remained stable.  She is afebrile.  She is up and ambulating with  no dizziness or syncope.  She feels well although remains pale.  Her  condition is stable and on this her postpartum day #2 she is judged to be in  satisfactory condition for discharge.   DISCHARGE INSTRUCTIONS:  Per [facility] handout.   DISCHARGE MEDICATIONS:  1. Motrin 600 mg p.o. q.6h. p.r.n. pain.  2. Tylox one to two p.o. q.3-4h. pain.  3. Prenatal vitamins plus iron supplement twice daily.   She will increase  her p.o. fluids and rest.  She will be seen for postpartum  follow-up at the office of [facility] in 6 weeks.   The patient did receive social work consult on postpartum day #1 secondary  to her history of marijuana use and history of depression with suicide  attempts.  She has phone numbers for counseling and support and will call  for any symptoms of postpartum depression.     Rica Koyanagi, C.N.M.               Naima A. Normand Sloop, M.D.    SDM/MEDQ  D:  01/15/2004  T:  01/15/2004  Job:  [id number]
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
## 2011-03-02 ENCOUNTER — Emergency Department (HOSPITAL_COMMUNITY): Payer: No Typology Code available for payment source

## 2011-03-02 ENCOUNTER — Emergency Department (HOSPITAL_COMMUNITY)
Admission: EM | Admit: 2011-03-02 | Discharge: 2011-03-02 | Disposition: A | Discharge status: Home / Self Care | Payer: No Typology Code available for payment source | Attending: Emergency Medicine | Admitting: Emergency Medicine

## 2011-03-02 DIAGNOSIS — IMO0002 Reserved for concepts with insufficient information to code with codable children: Secondary | ICD-10-CM | POA: Insufficient documentation

## 2011-03-02 DIAGNOSIS — Z23 Encounter for immunization: Secondary | ICD-10-CM | POA: Insufficient documentation

## 2011-03-02 DIAGNOSIS — M25569 Pain in unspecified knee: Secondary | ICD-10-CM | POA: Insufficient documentation

## 2011-03-02 DIAGNOSIS — S8010XA Contusion of unspecified lower leg, initial encounter: Secondary | ICD-10-CM | POA: Insufficient documentation

## 2011-03-08 ENCOUNTER — Emergency Department (HOSPITAL_COMMUNITY)
Admission: EM | Admit: 2011-03-08 | Discharge: 2011-03-08 | Disposition: A | Discharge status: Home / Self Care | Payer: Self-pay | Attending: Emergency Medicine | Admitting: Emergency Medicine

## 2011-03-08 DIAGNOSIS — IMO0002 Reserved for concepts with insufficient information to code with codable children: Secondary | ICD-10-CM | POA: Insufficient documentation

## 2011-05-01 ENCOUNTER — Emergency Department (HOSPITAL_COMMUNITY)
Admission: EM | Admit: 2011-05-01 | Discharge: 2011-05-01 | Disposition: A | Discharge status: Home / Self Care | Payer: Self-pay | Attending: Emergency Medicine | Admitting: Emergency Medicine

## 2011-05-01 ENCOUNTER — Emergency Department (HOSPITAL_COMMUNITY): Payer: Self-pay

## 2011-05-01 DIAGNOSIS — R220 Localized swelling, mass and lump, head: Secondary | ICD-10-CM | POA: Insufficient documentation

## 2011-05-01 DIAGNOSIS — F17200 Nicotine dependence, unspecified, uncomplicated: Secondary | ICD-10-CM | POA: Insufficient documentation

## 2011-05-01 DIAGNOSIS — X500XXA Overexertion from strenuous movement or load, initial encounter: Secondary | ICD-10-CM | POA: Insufficient documentation

## 2011-05-01 DIAGNOSIS — M545 Low back pain, unspecified: Secondary | ICD-10-CM | POA: Insufficient documentation

## 2011-05-01 DIAGNOSIS — M542 Cervicalgia: Secondary | ICD-10-CM | POA: Insufficient documentation

## 2011-05-01 LAB — URINALYSIS, ROUTINE W REFLEX MICROSCOPIC
Bilirubin Urine: NEGATIVE
Ketones, ur: NEGATIVE mg/dL
Nitrite: NEGATIVE
Protein, ur: NEGATIVE mg/dL
Urobilinogen, UA: 0.2 mg/dL (ref 0.0–1.0)

## 2011-11-05 ENCOUNTER — Emergency Department (HOSPITAL_COMMUNITY)
Admission: EM | Admit: 2011-11-05 | Discharge: 2011-11-05 | Disposition: A | Discharge status: Home / Self Care | Payer: Self-pay | Attending: Emergency Medicine | Admitting: Emergency Medicine

## 2011-11-05 ENCOUNTER — Encounter (HOSPITAL_COMMUNITY): Payer: Self-pay

## 2011-11-05 DIAGNOSIS — K0889 Other specified disorders of teeth and supporting structures: Secondary | ICD-10-CM

## 2011-11-05 DIAGNOSIS — R112 Nausea with vomiting, unspecified: Secondary | ICD-10-CM | POA: Insufficient documentation

## 2011-11-05 DIAGNOSIS — F17200 Nicotine dependence, unspecified, uncomplicated: Secondary | ICD-10-CM | POA: Insufficient documentation

## 2011-11-05 DIAGNOSIS — K089 Disorder of teeth and supporting structures, unspecified: Secondary | ICD-10-CM | POA: Insufficient documentation

## 2011-11-05 MED ORDER — PANTOPRAZOLE SODIUM 40 MG IV SOLR
40.0000 mg | Freq: Once | INTRAVENOUS | Status: AC
  Administered 2011-11-05: 40 mg via INTRAVENOUS
  Filled 2011-11-05: qty 40

## 2011-11-05 MED ORDER — HYDROCODONE-ACETAMINOPHEN 5-325 MG PO TABS: 1.0000 | ORAL_TABLET | ORAL | Status: AC | PRN

## 2011-11-05 MED ORDER — MORPHINE SULFATE 2 MG/ML IJ SOLN
2.0000 mg | Freq: Once | INTRAMUSCULAR | Status: AC
  Administered 2011-11-05: 2 mg via INTRAVENOUS
  Filled 2011-11-05: qty 1

## 2011-11-05 MED ORDER — ONDANSETRON HCL 4 MG/2ML IJ SOLN
4.0000 mg | Freq: Once | INTRAMUSCULAR | Status: AC
  Administered 2011-11-05: 4 mg via INTRAVENOUS

## 2011-11-05 MED ORDER — PROMETHAZINE HCL 25 MG PO TABS: 12.5000 mg | ORAL_TABLET | Freq: Four times a day (QID) | ORAL | Status: DC | PRN

## 2011-11-05 MED ORDER — ONDANSETRON HCL 4 MG/2ML IJ SOLN
INTRAMUSCULAR | Status: AC
  Filled 2011-11-05: qty 2

## 2011-11-05 MED ORDER — ONDANSETRON HCL 4 MG/2ML IJ SOLN
4.0000 mg | Freq: Once | INTRAMUSCULAR | Status: AC
  Administered 2011-11-05: 4 mg via INTRAVENOUS
  Filled 2011-11-05: qty 2

## 2011-11-05 MED ORDER — KETOROLAC TROMETHAMINE 30 MG/ML IJ SOLN
30.0000 mg | Freq: Once | INTRAMUSCULAR | Status: AC
  Administered 2011-11-05: 30 mg via INTRAVENOUS
  Filled 2011-11-05: qty 1

## 2011-11-05 NOTE — ED Provider Notes (Signed)
History     CSN: [id number]  Arrival date & time 11/05/11  1225   First MD Initiated Contact with Patient 11/05/11 1308      Chief Complaint  Patient presents with  . Dental Pain  . Emesis    (Consider location/radiation/quality/duration/timing/severity/associated sxs/prior treatment) HPI Joyce Klein is a 26 y.o. female who presents to the Emergency Department complaining of  Nausea, vomiting, and dental pain. She began with nausea and vomiting yesterday. Tooth pain began today at first lower right molar site that has been broken to the gumline  for several years. Denies fever, chills , chest pain, shortness of breath.  PCP  Health Dept.   History reviewed. No pertinent past medical history.  Past Surgical History  Procedure Date  . Tubal ligation     No family history on file.  History  Substance Use Topics  . Smoking status: Current Everyday Smoker -- 0.5 packs/day  . Smokeless tobacco: Not on file  . Alcohol Use: Yes    OB History    Grav Para Term Preterm Abortions TAB SAB Ect Mult Living                  Review of Systems A 10 review of systems reviewed and are negative for acute change except as noted in the HPI. Allergies  Roxicet  Home Medications   Current Outpatient Rx  Name Route Sig Dispense Refill  . ACETAMINOPHEN 500 MG PO TABS Oral Take 1,000 mg by mouth every 4 (four) hours as needed. pain      BP 111/74  Pulse 76  Temp(Src) 98.1 F (36.7 C) (Oral)  Resp 18  Ht 5\' 3"  (1.6 m)  Wt 155 lb (70.308 kg)  BMI 27.46 kg/m2  SpO2 98%  LMP 10/25/2011  Physical Exam Physical examination:  Nursing notes reviewed; Vital signs and O2 SAT reviewed;  Constitutional: Well developed, Well nourished, Well hydrated, In no acute distress; Head:  Normocephalic, atraumatic; Eyes: EOMI, PERRL, No scleral icterus; ENMT: Mouth and pharynx normal, Mucous membranes moist; Neck: Supple, Full range of motion, No lymphadenopathy; Cardiovascular: Regular rate  and rhythm, No murmur, rub, or gallop; Respiratory: Breath sounds clear & equal bilaterally, No rales, rhonchi, wheezes, or rub, Normal respiratory effort/excursion; Chest: Nontender, Movement normal; Abdomen: Soft, Nontender, Nondistended, Normal bowel sounds; Genitourinary: No CVA tenderness; Extremities: Pulses normal, No tenderness, No edema, No calf edema or asymmetry.; Neuro: AA&Ox3, Major CN grossly intact.  No gross focal motor or sensory deficits in extremities.; Skin: Color normal, Warm, Dry  ED Course  Procedures (including critical care time)    MDM  Patient with nausea and vomiting  That began yesterday and additional dental pain that began today. Given IVF, analgesic, antiemetic, antiinflammatory with improvement.  Pt feels improved after observation and/or treatment in ED.Pt stable in ED with no significant deterioration in condition.The patient appears reasonably screened and/or stabilized for discharge and I doubt any other medical condition or other [facility] requiring further screening, evaluation, or treatment in the ED at this time prior to discharge.  MDM Reviewed: nursing note and vitals           Nicoletta Dress. Colon Branch, MD 11/05/11 [phone]

## 2011-11-05 NOTE — Discharge Instructions (Signed)
Drink lots of fluids.  Bland diet for the next 6-8 hours then progress as you can tolerate. Use the pain and nausea  medicine as needed. Use BRAT if you develop  diarrhea. Follow up with your doctor. Follow up with a dentist.  B.R.A.T. Diet Your doctor has recommended the B.R.A.T. diet for you or your child until the condition improves. This is often used to help control diarrhea and vomiting symptoms. If you or your child can tolerate clear liquids, you may have:  Bananas.   Rice.   Applesauce.   Toast (and other simple starches such as crackers, potatoes, noodles).  Be sure to avoid dairy products, meats, and fatty foods until symptoms are better. Fruit juices such as apple, grape, and prune juice can make diarrhea worse. Avoid these. Continue this diet for 2 days or as instructed by your caregiver. Document Released: 08/16/2005 Document Revised: 08/05/2011 Document Reviewed: 02/02/2007 [facility] Patient Information 2012 [place], [place].

## 2011-11-05 NOTE — ED Notes (Signed)
Pt presents with right lower dental pain and vomiting. Pain started yesterday and vomiting started today.

## 2011-12-13 IMAGING — CR DG LUMBAR SPINE COMPLETE 4+V
5 series · 5 of 5 positions shown · non-contrast
Comparison: Abdomen pelvis CT 01/13/2010

CLINICAL DATA: Back pain

LUMBAR SPINE - COMPLETE 4+ VIEW

[t lumbar spine ap]
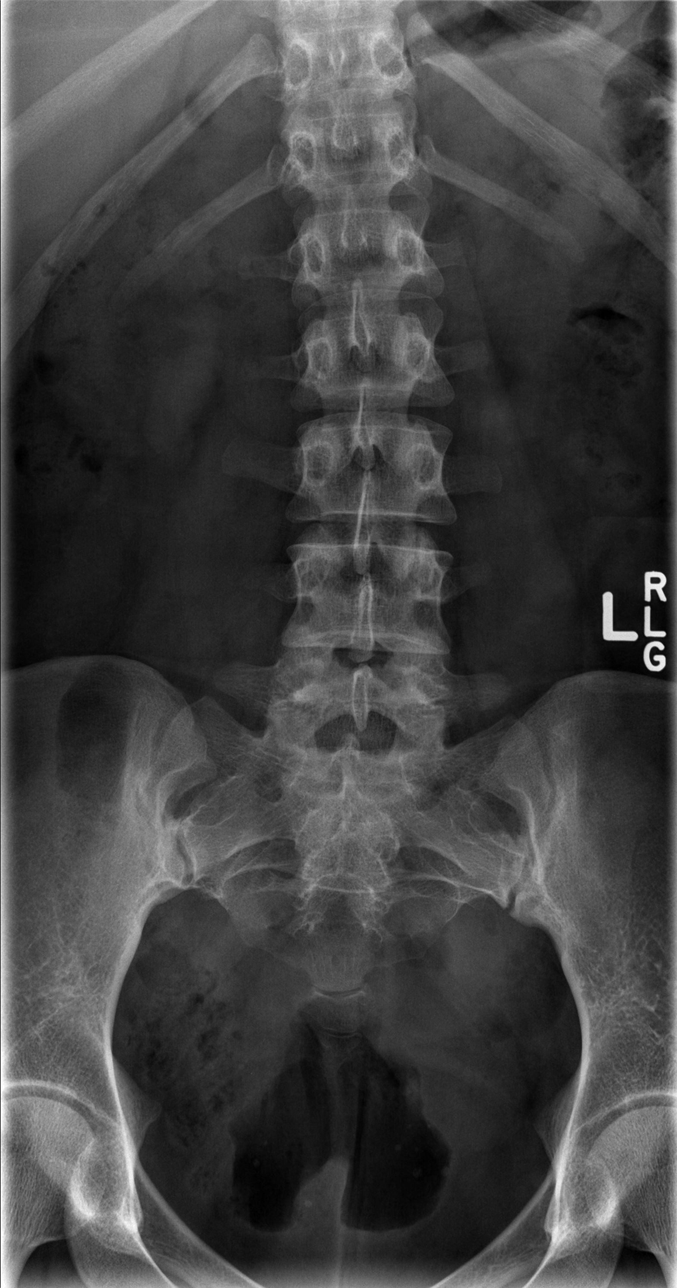

[t lumbar spine obl (1 of 2)]
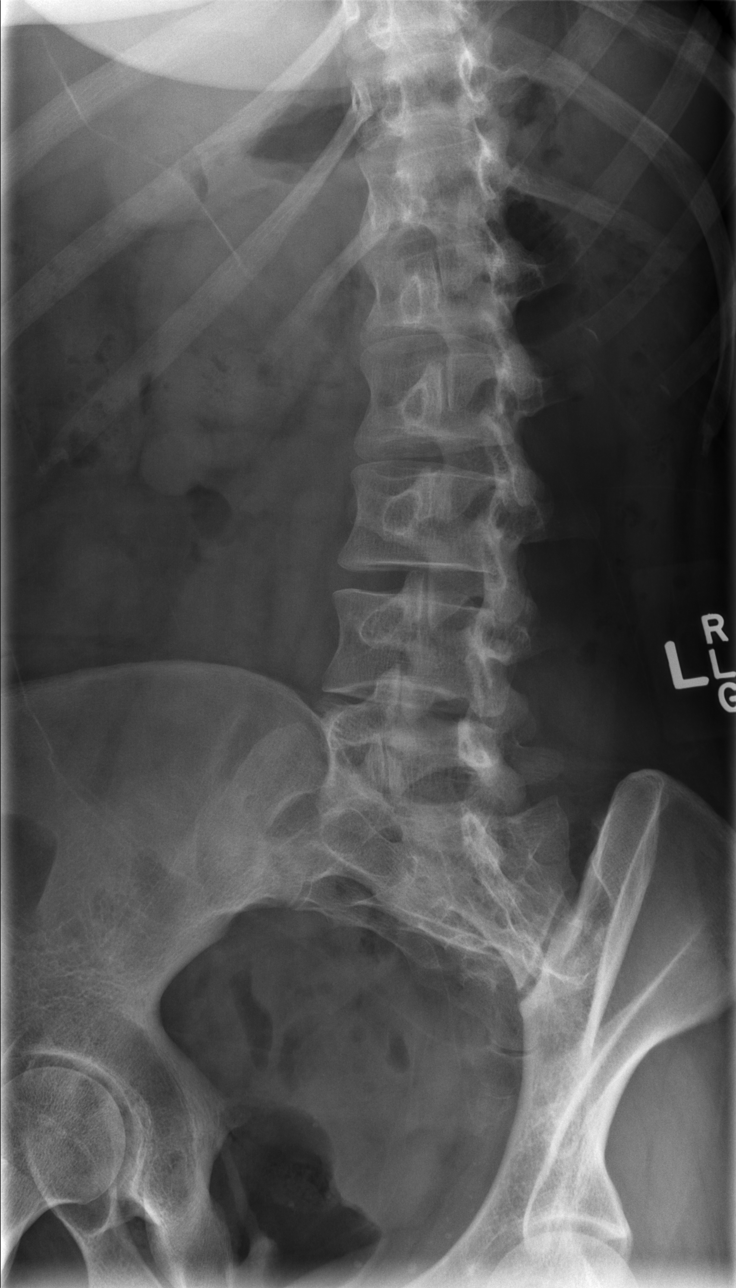

[t lumbar spine obl (2 of 2)]
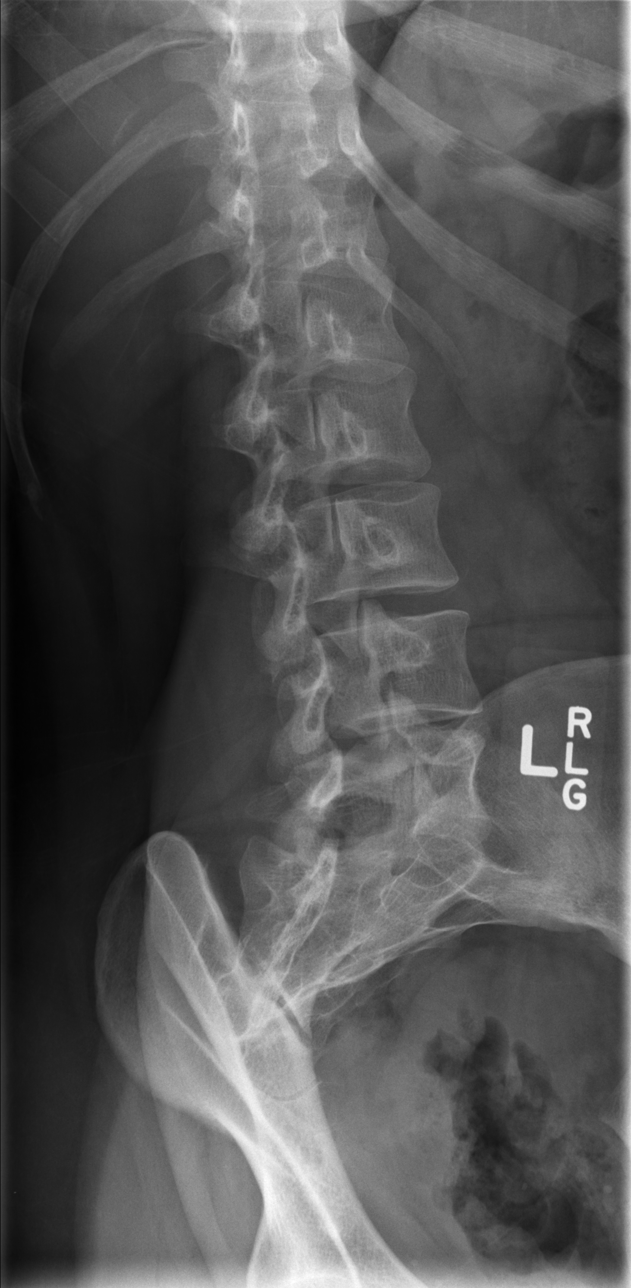

[t lumbar spine lat]
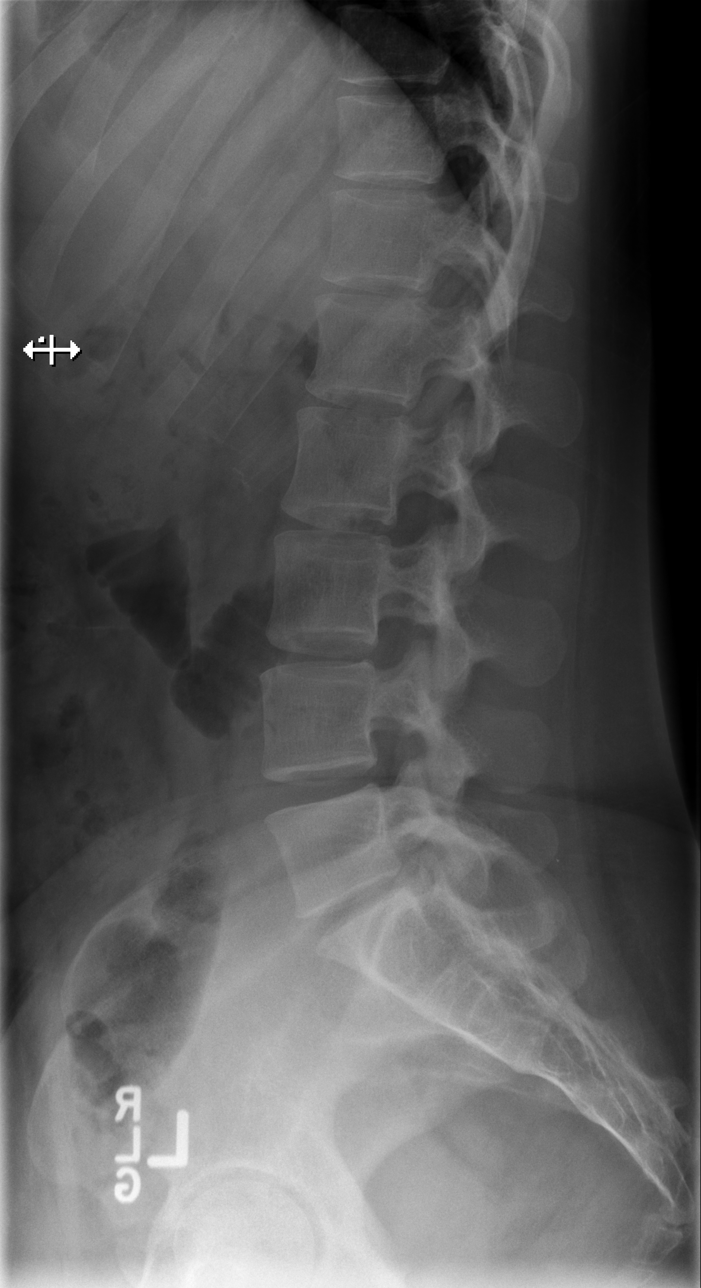

[t lumbar l-5 s-1 spot]
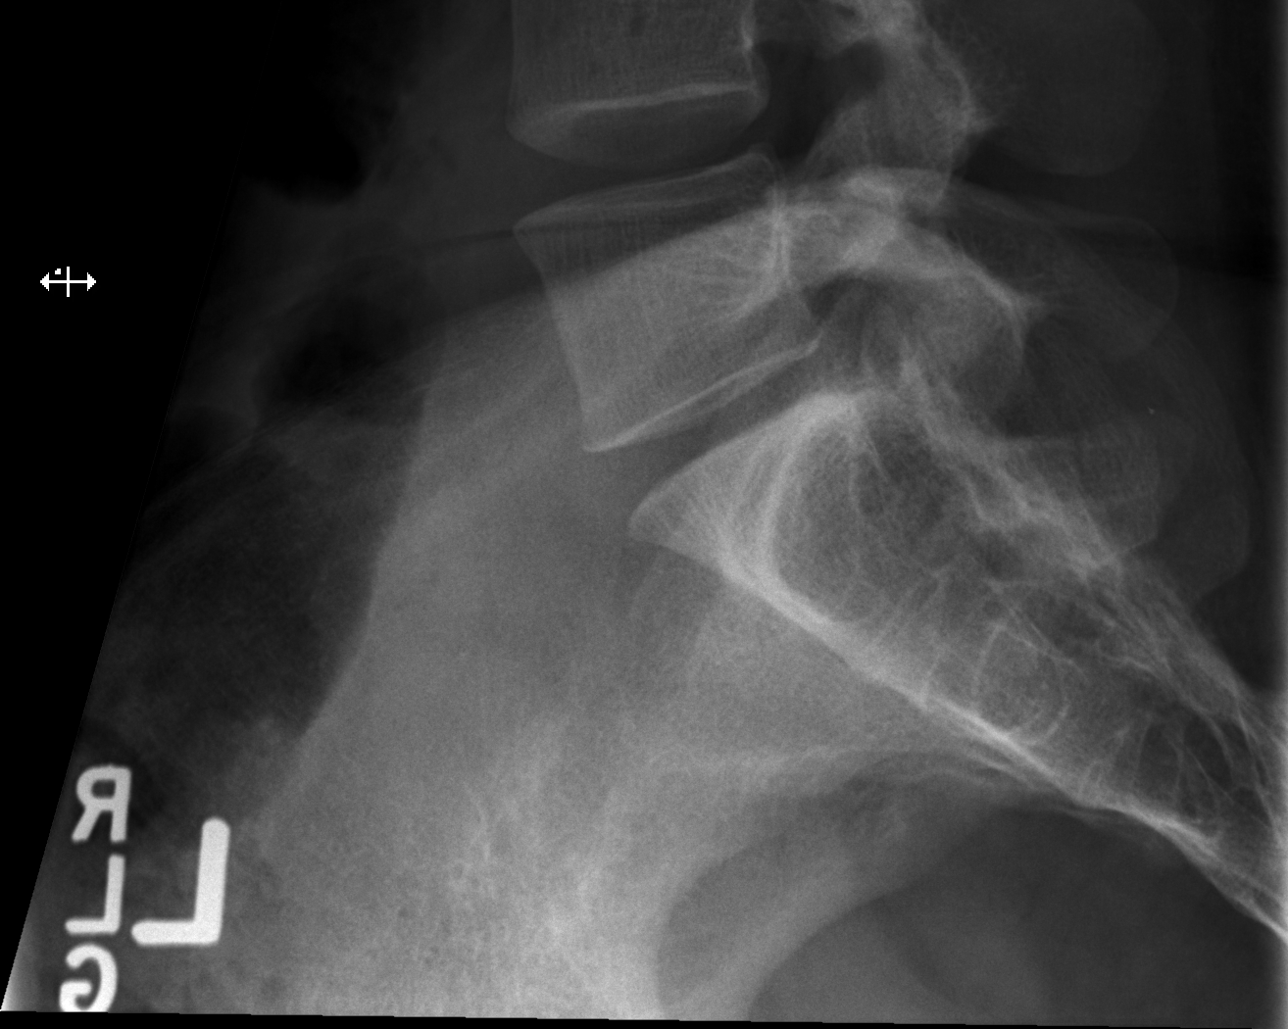

[5 of 5 positions shown; findings below may reference images not displayed]

FINDINGS: Lumbar spine vertebral bodies are normal in height
alignment.  No acute fracture or significant degenerative change.
Disc heights are maintained.  There is no pars defect.  Sacroiliac
joints are unremarkable.  Visualized bowel gas pattern
nonobstructive.
IMPRESSION: Negative.

## 2012-02-10 ENCOUNTER — Emergency Department: Payer: Self-pay | Admitting: Emergency Medicine

## 2012-02-10 LAB — PREGNANCY, URINE: Pregnancy Test, Urine: NEGATIVE m[IU]/mL

## 2012-02-10 LAB — URINALYSIS, COMPLETE
Bacteria: NONE SEEN
Blood: NEGATIVE
Specific Gravity: 1.023 (ref 1.003–1.030)
WBC UR: 5 /HPF (ref 0–5)

## 2012-02-10 LAB — CBC
HGB: 15.1 g/dL (ref 12.0–16.0)
MCH: 33.6 pg (ref 26.0–34.0)
MCV: 101 fL — ABNORMAL HIGH (ref 80–100)
Platelet: 218 10*3/uL (ref 150–440)
RDW: 12.9 % (ref 11.5–14.5)
WBC: 8.5 10*3/uL (ref 3.6–11.0)

## 2012-02-10 LAB — BASIC METABOLIC PANEL
BUN: 9 mg/dL (ref 7–18)
Calcium, Total: 9.2 mg/dL (ref 8.5–10.1)
Chloride: 104 mmol/L (ref 98–107)
EGFR (African American): >60
EGFR (Non-African Amer.): >60
Glucose: 116 mg/dL — ABNORMAL HIGH (ref 65–99)
Osmolality: 277 (ref 275–301)
Sodium: 139 mmol/L (ref 136–145)

## 2012-03-14 ENCOUNTER — Encounter (HOSPITAL_COMMUNITY): Payer: Self-pay

## 2012-03-14 ENCOUNTER — Inpatient Hospital Stay (HOSPITAL_COMMUNITY)
Admission: AD | Admit: 2012-03-14 | Discharge: 2012-03-14 | Disposition: A | Discharge status: Home / Self Care | Payer: Self-pay | Source: Ambulatory Visit | Attending: Obstetrics & Gynecology | Admitting: Obstetrics & Gynecology

## 2012-03-14 DIAGNOSIS — Z915 Personal history of self-harm: Secondary | ICD-10-CM

## 2012-03-14 DIAGNOSIS — N926 Irregular menstruation, unspecified: Secondary | ICD-10-CM

## 2012-03-14 DIAGNOSIS — N949 Unspecified condition associated with female genital organs and menstrual cycle: Secondary | ICD-10-CM

## 2012-03-14 DIAGNOSIS — G43909 Migraine, unspecified, not intractable, without status migrainosus: Secondary | ICD-10-CM | POA: Diagnosis not present

## 2012-03-14 DIAGNOSIS — F1910 Other psychoactive substance abuse, uncomplicated: Secondary | ICD-10-CM | POA: Clinically undetermined

## 2012-03-14 DIAGNOSIS — Z9851 Tubal ligation status: Secondary | ICD-10-CM

## 2012-03-14 DIAGNOSIS — R109 Unspecified abdominal pain: Secondary | ICD-10-CM | POA: Insufficient documentation

## 2012-03-14 DIAGNOSIS — K219 Gastro-esophageal reflux disease without esophagitis: Secondary | ICD-10-CM | POA: Diagnosis not present

## 2012-03-14 DIAGNOSIS — K8020 Calculus of gallbladder without cholecystitis without obstruction: Secondary | ICD-10-CM | POA: Diagnosis not present

## 2012-03-14 DIAGNOSIS — E669 Obesity, unspecified: Secondary | ICD-10-CM | POA: Diagnosis present

## 2012-03-14 DIAGNOSIS — Z9151 Personal history of suicidal behavior: Secondary | ICD-10-CM

## 2012-03-14 DIAGNOSIS — F17200 Nicotine dependence, unspecified, uncomplicated: Secondary | ICD-10-CM | POA: Diagnosis present

## 2012-03-14 DIAGNOSIS — F3130 Bipolar disorder, current episode depressed, mild or moderate severity, unspecified: Secondary | ICD-10-CM | POA: Clinically undetermined

## 2012-03-14 HISTORY — DX: Major depressive disorder, single episode, unspecified: F32.9

## 2012-03-14 HISTORY — DX: Depression, unspecified: F32.A

## 2012-03-14 HISTORY — DX: Headache: R51

## 2012-03-14 LAB — URINALYSIS, ROUTINE W REFLEX MICROSCOPIC
Bilirubin Urine: NEGATIVE
Glucose, UA: NEGATIVE mg/dL
Hgb urine dipstick: NEGATIVE
Nitrite: NEGATIVE
Specific Gravity, Urine: 1.025 (ref 1.005–1.030)
pH: 6 (ref 5.0–8.0)

## 2012-03-14 LAB — HCG, QUANTITATIVE, PREGNANCY: hCG, Beta Chain, Quant, S: <1 m[IU]/mL (ref <5)

## 2012-03-14 NOTE — MAU Provider Note (Signed)
Seen also by me, agree with note 
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
## 2012-03-14 NOTE — MAU Note (Signed)
Patient states she has had 4 pregnancy tests over the past month the first positive, 2 and 3 negative and positive this am. States she has had lower abdominal pain and vomiting x 4 today.

## 2012-03-14 NOTE — MAU Note (Signed)
Pt states had 2 positive upt's this am. LMP-02/03/2012. Denies bleeding or abnormal vaginal discharge. Lower abd pain began 2 days ago. Rates pain 7/10. Intermittent pain, dull cramping.

## 2012-04-25 ENCOUNTER — Encounter (HOSPITAL_COMMUNITY): Payer: Self-pay | Admitting: *Deleted

## 2012-04-25 DIAGNOSIS — N949 Unspecified condition associated with female genital organs and menstrual cycle: Secondary | ICD-10-CM | POA: Insufficient documentation

## 2012-04-25 DIAGNOSIS — N730 Acute parametritis and pelvic cellulitis: Secondary | ICD-10-CM | POA: Insufficient documentation

## 2012-04-25 LAB — URINALYSIS, ROUTINE W REFLEX MICROSCOPIC
Bilirubin Urine: NEGATIVE
Hgb urine dipstick: NEGATIVE
Ketones, ur: NEGATIVE mg/dL
Nitrite: NEGATIVE
Urobilinogen, UA: 1 mg/dL (ref 0.0–1.0)

## 2012-04-25 LAB — COMPREHENSIVE METABOLIC PANEL
ALT: 15 U/L (ref 0–35)
AST: 22 U/L (ref 0–37)
Alkaline Phosphatase: 64 U/L (ref 39–117)
CO2: 28 mEq/L (ref 19–32)
Chloride: 102 mEq/L (ref 96–112)
GFR calc Af Amer: >90 mL/min (ref >90)
GFR calc non Af Amer: >90 mL/min (ref >90)
Glucose, Bld: 118 mg/dL — ABNORMAL HIGH (ref 70–99)
Potassium: 4.5 mEq/L (ref 3.5–5.1)
Sodium: 140 mEq/L (ref 135–145)
Total Bilirubin: 0.2 mg/dL — ABNORMAL LOW (ref 0.3–1.2)

## 2012-04-25 LAB — CBC WITH DIFFERENTIAL/PLATELET
Basophils Absolute: 0 10*3/uL (ref 0.0–0.1)
Eosinophils Relative: 3 % (ref 0–5)
Lymphocytes Relative: 28 % (ref 12–46)
Lymphs Abs: 3.3 10*3/uL (ref 0.7–4.0)
MCV: 100.2 fL — ABNORMAL HIGH (ref 78.0–100.0)
Neutro Abs: 7.1 10*3/uL (ref 1.7–7.7)
Neutrophils Relative %: 61 % (ref 43–77)
Platelets: 223 10*3/uL (ref 150–400)
RBC: 4.3 MIL/uL (ref 3.87–5.11)
WBC: 11.8 10*3/uL — ABNORMAL HIGH (ref 4.0–10.5)

## 2012-04-25 LAB — URINE MICROSCOPIC-ADD ON

## 2012-04-25 NOTE — ED Notes (Signed)
The pt has had lower abd pain since yesterday. nv no diarrhea.  lmp 2 days ago

## 2012-04-26 ENCOUNTER — Emergency Department (HOSPITAL_COMMUNITY)
Admission: EM | Admit: 2012-04-26 | Discharge: 2012-04-26 | Disposition: A | Discharge status: Home / Self Care | Payer: Medicaid Other | Attending: Emergency Medicine | Admitting: Emergency Medicine

## 2012-04-26 ENCOUNTER — Emergency Department (HOSPITAL_COMMUNITY): Payer: Medicaid Other

## 2012-04-26 DIAGNOSIS — N730 Acute parametritis and pelvic cellulitis: Secondary | ICD-10-CM

## 2012-04-26 LAB — WET PREP, GENITAL
Trich, Wet Prep: NONE SEEN
Yeast Wet Prep HPF POC: NONE SEEN

## 2012-04-26 MED ORDER — AZITHROMYCIN 250 MG PO TABS
1000.0000 mg | ORAL_TABLET | Freq: Once | ORAL | Status: AC
  Administered 2012-04-26: 1000 mg via ORAL
  Filled 2012-04-26: qty 4

## 2012-04-26 MED ORDER — LIDOCAINE HCL (PF) 1 % IJ SOLN
INTRAMUSCULAR | Status: AC
  Administered 2012-04-26: 2 mL
  Filled 2012-04-26: qty 5

## 2012-04-26 MED ORDER — ONDANSETRON HCL 4 MG/2ML IJ SOLN
4.0000 mg | Freq: Once | INTRAMUSCULAR | Status: AC
  Administered 2012-04-26: 4 mg via INTRAVENOUS
  Filled 2012-04-26: qty 2

## 2012-04-26 MED ORDER — TRAMADOL HCL 50 MG PO TABS: 50.0000 mg | ORAL_TABLET | Freq: Four times a day (QID) | ORAL | Status: AC | PRN

## 2012-04-26 MED ORDER — HYDROMORPHONE HCL PF 1 MG/ML IJ SOLN
1.0000 mg | Freq: Once | INTRAMUSCULAR | Status: AC
  Administered 2012-04-26: 1 mg via INTRAVENOUS
  Filled 2012-04-26: qty 1

## 2012-04-26 MED ORDER — PROMETHAZINE HCL 25 MG PO TABS: 25.0000 mg | ORAL_TABLET | Freq: Four times a day (QID) | ORAL | Status: DC | PRN

## 2012-04-26 MED ORDER — DOXYCYCLINE HYCLATE 100 MG PO CAPS: 100.0000 mg | ORAL_CAPSULE | Freq: Two times a day (BID) | ORAL | Status: AC

## 2012-04-26 MED ORDER — CEFTRIAXONE SODIUM 250 MG IJ SOLR
250.0000 mg | Freq: Once | INTRAMUSCULAR | Status: AC
  Administered 2012-04-26: 250 mg via INTRAMUSCULAR
  Filled 2012-04-26: qty 250

## 2012-04-26 MED ORDER — SODIUM CHLORIDE 0.9 % IV SOLN
INTRAVENOUS | Status: DC
  Administered 2012-04-26: 01:00:00 via INTRAVENOUS

## 2012-04-26 NOTE — ED Notes (Signed)
IV removed from right forearm.

## 2012-04-26 NOTE — ED Provider Notes (Signed)
History     CSN: [id number]  Arrival date & time 04/25/12  2142   First MD Initiated Contact with Patient 04/26/12 0041      Chief Complaint  Patient presents with  . Abdominal Pain    (Consider location/radiation/quality/duration/timing/severity/associated sxs/prior treatment) HPI Hx per PT. Lower ABD pain since yesterday with white vag discharge, no dysuria, no fevers, pain is sharp and not radiating, denies h/o same. Pain mod to severe, no back pain. Some nausea no vomiting. No known aggrevating/ alleviating factors  Past Medical History  Diagnosis Date  . Depression   . Headache     Past Surgical History  Procedure Date  . Tubal ligation   . Tubal ligation     Family History  Problem Relation Age of Onset  . Other Neg Hx     History  Substance Use Topics  . Smoking status: Current Everyday Smoker -- 0.5 packs/day for 14 years    Types: Cigarettes  . Smokeless tobacco: Never Used  . Alcohol Use: Yes    OB History    Grav Para Term Preterm Abortions TAB SAB Ect Mult Living   13 2 2  11  11   2       Review of Systems  Constitutional: Negative for fever and chills.  HENT: Negative for neck pain and neck stiffness.   Eyes: Negative for pain.  Respiratory: Negative for shortness of breath.   Cardiovascular: Negative for chest pain.  Gastrointestinal: Negative for abdominal pain.  Genitourinary: Positive for pelvic pain. Negative for dysuria.  Musculoskeletal: Negative for back pain.  Skin: Negative for rash.  Neurological: Negative for headaches.  All other systems reviewed and are negative.    Allergies  Coconut oil and Bee venom  Home Medications   Current Outpatient Rx  Name Route Sig Dispense Refill  . ACETAMINOPHEN 500 MG PO TABS Oral Take 1,000 mg by mouth every 4 (four) hours as needed. For pain    . ALBUTEROL SULFATE HFA 108 (90 BASE) MCG/ACT IN AERS Inhalation Inhale 2 puffs into the lungs every 6 (six) hours as needed. For shortness of  breath      BP 94/59  Pulse 85  Temp 98 F (36.7 C) (Oral)  Resp 20  SpO2 98%  LMP 04/23/2012  Physical Exam  Constitutional: She is oriented to person, place, and time. She appears well-developed and well-nourished.  HENT:  Head: Normocephalic and atraumatic.  Eyes: Conjunctivae and EOM are normal. Pupils are equal, round, and reactive to light.  Neck: Trachea normal. Neck supple. No thyromegaly present.  Cardiovascular: Normal rate, regular rhythm, S1 normal, S2 normal and normal pulses.     No systolic murmur is present   No diastolic murmur is present  Pulses:      Radial pulses are 2+ on the right side, and 2+ on the left side.  Pulmonary/Chest: Effort normal and breath sounds normal. She has no wheezes. She has no rhonchi. She has no rales. She exhibits no tenderness.  Abdominal: Soft. Normal appearance and bowel sounds are normal. There is no CVA tenderness and negative Murphy's sign.       Midline suprapubic TTP, no tenderness otherwise  Genitourinary:       Mod vag discharge - white. No adnexal mass or tenderness. Mild CMT  Musculoskeletal:       BLE:s Calves nontender, no cords or erythema, negative Homans sign  Neurological: She is alert and oriented to person, place, and time. She has normal strength.  No cranial nerve deficit or sensory deficit. GCS eye subscore is 4. GCS verbal subscore is 5. GCS motor subscore is 6.  Skin: Skin is warm and dry. No rash noted. She is not diaphoretic.  Psychiatric: Her speech is normal.       Cooperative and appropriate    ED Course  Procedures (including critical care time)  Results for orders placed during the hospital encounter of 04/26/12  CBC WITH DIFFERENTIAL      Component Value Range   WBC 11.8 (*) 4.0 - 10.5 K/uL   RBC 4.30  3.87 - 5.11 MIL/uL   Hemoglobin 14.8  12.0 - 15.0 g/dL   HCT 45.4  09.8 - 11.9 %   MCV 100.2 (*) 78.0 - 100.0 fL   MCH 34.4 (*) 26.0 - 34.0 pg   MCHC 34.3  30.0 - 36.0 g/dL   RDW 14.7  82.9 -  56.2 %   Platelets 223  150 - 400 K/uL   Neutrophils Relative 61  43 - 77 %   Neutro Abs 7.1  1.7 - 7.7 K/uL   Lymphocytes Relative 28  12 - 46 %   Lymphs Abs 3.3  0.7 - 4.0 K/uL   Monocytes Relative 8  3 - 12 %   Monocytes Absolute 0.9  0.1 - 1.0 K/uL   Eosinophils Relative 3  0 - 5 %   Eosinophils Absolute 0.3  0.0 - 0.7 K/uL   Basophils Relative 0  0 - 1 %   Basophils Absolute 0.0  0.0 - 0.1 K/uL  COMPREHENSIVE METABOLIC PANEL      Component Value Range   Sodium 140  135 - 145 mEq/L   Potassium 4.5  3.5 - 5.1 mEq/L   Chloride 102  96 - 112 mEq/L   CO2 28  19 - 32 mEq/L   Glucose, Bld 118 (*) 70 - 99 mg/dL   BUN 9  6 - 23 mg/dL   Creatinine, Ser 1.30  0.50 - 1.10 mg/dL   Calcium 9.8  8.4 - 86.5 mg/dL   Total Protein 7.8  6.0 - 8.3 g/dL   Albumin 4.3  3.5 - 5.2 g/dL   AST 22  0 - 37 U/L   ALT 15  0 - 35 U/L   Alkaline Phosphatase 64  39 - 117 U/L   Total Bilirubin 0.2 (*) 0.3 - 1.2 mg/dL   GFR calc non Af Amer >90  >90 mL/min   GFR calc Af Amer >90  >90 mL/min  URINALYSIS, ROUTINE W REFLEX MICROSCOPIC      Component Value Range   Color, Urine YELLOW  YELLOW   APPearance CLOUDY (*) CLEAR   Specific Gravity, Urine 1.024  1.005 - 1.030   pH 6.5  5.0 - 8.0   Glucose, UA NEGATIVE  NEGATIVE mg/dL   Hgb urine dipstick NEGATIVE  NEGATIVE   Bilirubin Urine NEGATIVE  NEGATIVE   Ketones, ur NEGATIVE  NEGATIVE mg/dL   Protein, ur NEGATIVE  NEGATIVE mg/dL   Urobilinogen, UA 1.0  0.0 - 1.0 mg/dL   Nitrite NEGATIVE  NEGATIVE   Leukocytes, UA TRACE (*) NEGATIVE  PREGNANCY, URINE      Component Value Range   Preg Test, Ur NEGATIVE  NEGATIVE  URINE MICROSCOPIC-ADD ON      Component Value Range   Squamous Epithelial / LPF FEW (*) RARE   WBC, UA 0-2  <3 WBC/hpf   Bacteria, UA RARE  RARE  WET PREP, GENITAL  Component Value Range   Yeast Wet Prep HPF POC NONE SEEN  NONE SEEN   Trich, Wet Prep NONE SEEN  NONE SEEN   Clue Cells Wet Prep HPF POC FEW (*) NONE SEEN   WBC, Wet Prep  HPF POC NONE SEEN  NONE SEEN   US Transvaginal Non-ob  04/26/2012  *RADIOLOGY REPORT*  Clinical Data: Pelvic pain  TRANSABDOMINAL AND TRANSVAGINAL ULTRASOUND OF PELVIS Technique:  Both transabdominal and transvaginal ultrasound examinations of the pelvis were performed. Transabdominal technique was performed for global imaging of the pelvis including uterus, ovaries, adnexal regions, and pelvic cul-de-sac.  It was necessary to proceed with endovaginal exam following the transabdominal exam to visualize the endometrium and adnexa.  Comparison:  None  Findings:  Uterus: Normal in size and appearance.  Note that significant cervical tenderness was noted during the examination.  The uterus measures 8.5 x 3.7 x 5.6 cm.  Endometrium: Normal in thickness and appearance, measuring 5 mm.  Right ovary:  Normal sonographic appearance, measuring 2.4 x 2.2 x 2.0 cm.  Left ovary: Measures 2.7 x 1.5 x 1.8 cm, within normal limits.  Other findings: Small amount of free fluid.  IMPRESSION: Small amount of free fluid may be physiologic. No sonographic abnormality identified otherwise.  Significant cervical tenderness during examination.  Correlate clinically for PID.   Original Report Authenticated By: Waneta Martins, M.D.    US Pelvis Complete  04/26/2012  *RADIOLOGY REPORT*  Clinical Data: Pelvic pain  TRANSABDOMINAL AND TRANSVAGINAL ULTRASOUND OF PELVIS Technique:  Both transabdominal and transvaginal ultrasound examinations of the pelvis were performed. Transabdominal technique was performed for global imaging of the pelvis including uterus, ovaries, adnexal regions, and pelvic cul-de-sac.  It was necessary to proceed with endovaginal exam following the transabdominal exam to visualize the endometrium and adnexa.  Comparison:  None  Findings:  Uterus: Normal in size and appearance.  Note that significant cervical tenderness was noted during the examination.  The uterus measures 8.5 x 3.7 x 5.6 cm.  Endometrium: Normal  in thickness and appearance, measuring 5 mm.  Right ovary:  Normal sonographic appearance, measuring 2.4 x 2.2 x 2.0 cm.  Left ovary: Measures 2.7 x 1.5 x 1.8 cm, within normal limits.  Other findings: Small amount of free fluid.  IMPRESSION: Small amount of free fluid may be physiologic. No sonographic abnormality identified otherwise.  Significant cervical tenderness during examination.  Correlate clinically for PID.   Original Report Authenticated By: Waneta Martins, M.D.    IV dilaudid/ zofran. IVFs.   Rocephin/ azithro. GC/ Chlam pending.  Improved on recheck. Plan f/u Health dept. Rx Doxy provided. Stable for d/c home with precautions verbalized as understood.    Presentation suggests PID MDM   VS and nursing notes reviewed. [place], labs and UA reviewed as above. IV narcotics. Serial exams.         Sunnie Nielsen, MD 04/26/12 [phone]

## 2012-04-27 LAB — GC/CHLAMYDIA PROBE AMP, GENITAL
Chlamydia, DNA Probe: NEGATIVE
GC Probe Amp, Genital: NEGATIVE

## 2012-05-16 ENCOUNTER — Inpatient Hospital Stay (HOSPITAL_COMMUNITY)
Admission: AD | Admit: 2012-05-16 | Discharge: 2012-05-16 | Disposition: A | Discharge status: Home / Self Care | Payer: Medicaid Other | Source: Ambulatory Visit | Attending: Obstetrics & Gynecology | Admitting: Obstetrics & Gynecology

## 2012-05-16 ENCOUNTER — Encounter (HOSPITAL_COMMUNITY): Payer: Self-pay | Admitting: Family

## 2012-05-16 DIAGNOSIS — M7918 Myalgia, other site: Secondary | ICD-10-CM

## 2012-05-16 DIAGNOSIS — R109 Unspecified abdominal pain: Secondary | ICD-10-CM | POA: Insufficient documentation

## 2012-05-16 DIAGNOSIS — R112 Nausea with vomiting, unspecified: Secondary | ICD-10-CM | POA: Insufficient documentation

## 2012-05-16 DIAGNOSIS — IMO0001 Reserved for inherently not codable concepts without codable children: Secondary | ICD-10-CM

## 2012-05-16 LAB — URINALYSIS, ROUTINE W REFLEX MICROSCOPIC
Bilirubin Urine: NEGATIVE
Glucose, UA: NEGATIVE mg/dL
Ketones, ur: NEGATIVE mg/dL
Leukocytes, UA: NEGATIVE
Nitrite: NEGATIVE
Protein, ur: NEGATIVE mg/dL
Specific Gravity, Urine: 1.01 (ref 1.005–1.030)
Urobilinogen, UA: 0.2 mg/dL (ref 0.0–1.0)
pH: 6.5 (ref 5.0–8.0)

## 2012-05-16 LAB — URINE MICROSCOPIC-ADD ON

## 2012-05-16 LAB — POCT PREGNANCY, URINE: Preg Test, Ur: NEGATIVE

## 2012-05-16 NOTE — MAU Note (Signed)
Pt states she was at [facility], seen and treated for PID, on 8/28. Pt states about one week ago pain was getting worse and that 5 days ago she had extreme pain with intercourse, and has been on and off since then. Pt states period started yesterday, and she is two weeks late. Pt states pain got worse yesterday and she decided to come in.

## 2012-05-16 NOTE — ED Provider Notes (Signed)
History     CSN: [id number]  Arrival date and time: 05/16/12 1012   First Provider Initiated Contact with Patient 05/16/12 1047      No chief complaint on file.  HPI Patient is a 26 year old [id number] recently treated for PID (8/28) having completed her antibiotics on 9/12 complaining of sharp non-radiating 9/10 right groin pain that began 1 week ago after hitting her hip with a dolly and exacerbated while pushing a couch across the room. The pain has increased in severity over time and is worse with movement and intercourse.  Ibprofen 800mg  and heating pads provide little to no relief.  She has also had nausea/vomiting, vaginal bleeding and lower abdominal cramping that coincide with the start of her menstrual cycle that began yesterday. No CP ,SOB, fevers, headache, numbness.  OB History    Grav Para Term Preterm Abortions TAB SAB Ect Mult Living   14 2 2  12  12   2       Past Medical History  Diagnosis Date  . Depression   . Headache     Past Surgical History  Procedure Date  . Tubal ligation   . Tubal ligation     Family History  Problem Relation Age of Onset  . Other Neg Hx     History  Substance Use Topics  . Smoking status: Current Every Day Smoker -- 0.5 packs/day for 14 years    Types: Cigarettes  . Smokeless tobacco: Never Used  . Alcohol Use: Yes    Allergies:  Allergies  Allergen Reactions  . Coconut Oil Anaphylaxis  . Other Anaphylaxis    Pecans  . Bee Venom     Prescriptions prior to admission  Medication Sig Dispense Refill  . acetaminophen (TYLENOL) 500 MG tablet Take 1,000 mg by mouth every 4 (four) hours as needed. For pain      . ibuprofen (ADVIL,MOTRIN) 200 MG tablet Take 200 mg by mouth every 6 (six) hours as needed. pain      . albuterol (PROVENTIL HFA;VENTOLIN HFA) 108 (90 BASE) MCG/ACT inhaler Inhale 2 puffs into the lungs every 6 (six) hours as needed. For shortness of breath      . promethazine (PHENERGAN) 25 MG tablet Take 0.5  tablets (12.5 mg total) by mouth every 6 (six) hours as needed for nausea.  10 tablet  0  . promethazine (PHENERGAN) 25 MG tablet Take 1 tablet (25 mg total) by mouth every 6 (six) hours as needed for nausea.  30 tablet  0    ROS See HPI Physical Exam   Blood pressure 113/66, pulse 87, resp. rate 18, last menstrual period 05/15/2012.  Physical Exam  Constitutional: She is oriented to person, place, and time. She appears well-developed and well-nourished. No distress.  HENT:  Head: Normocephalic and atraumatic.  Left Ear: External ear normal.  Eyes: Conjunctivae normal are normal. Pupils are equal, round, and reactive to light.  Cardiovascular: Normal rate, regular rhythm, S2 normal and normal heart sounds.  Exam reveals no gallop, no S3, no S4 and no friction rub.   Respiratory: Effort normal and breath sounds normal. No respiratory distress. She has no wheezes. She has no rales. She exhibits no tenderness.  GI: Soft. Bowel sounds are normal. She exhibits no distension, no pulsatile liver, no abdominal bruit and no mass. There is no hepatosplenomegaly. There is tenderness in the right lower quadrant and left lower quadrant. There is no rebound and no guarding.  Musculoskeletal:  Right hip: She exhibits tenderness.  Neurological: She is alert and oriented to person, place, and time.  Skin: Skin is warm and dry. No rash noted. She is not diaphoretic. No erythema. No pallor.    MAU Course  Procedures   Assessment and Plan  Musculoskeletal pain.  Rest, ice, heat. Contact information given to Orthopedics, pt to call when she reinstates Medicaid Return to MAU as needed  Doran Heater 05/16/2012, 11:23 AM

## 2012-10-06 ENCOUNTER — Encounter (HOSPITAL_COMMUNITY): Payer: Self-pay | Admitting: *Deleted

## 2012-10-06 ENCOUNTER — Emergency Department (HOSPITAL_COMMUNITY): Payer: Medicaid Other

## 2012-10-06 ENCOUNTER — Emergency Department (HOSPITAL_COMMUNITY)
Admission: EM | Admit: 2012-10-06 | Discharge: 2012-10-06 | Disposition: A | Discharge status: Home / Self Care | Payer: Medicaid Other | Attending: Emergency Medicine | Admitting: Emergency Medicine

## 2012-10-06 ENCOUNTER — Other Ambulatory Visit: Payer: Self-pay

## 2012-10-06 DIAGNOSIS — R071 Chest pain on breathing: Secondary | ICD-10-CM | POA: Insufficient documentation

## 2012-10-06 DIAGNOSIS — Z79899 Other long term (current) drug therapy: Secondary | ICD-10-CM | POA: Insufficient documentation

## 2012-10-06 DIAGNOSIS — Z7982 Long term (current) use of aspirin: Secondary | ICD-10-CM | POA: Insufficient documentation

## 2012-10-06 DIAGNOSIS — F410 Panic disorder [episodic paroxysmal anxiety] without agoraphobia: Secondary | ICD-10-CM | POA: Insufficient documentation

## 2012-10-06 DIAGNOSIS — F17200 Nicotine dependence, unspecified, uncomplicated: Secondary | ICD-10-CM | POA: Insufficient documentation

## 2012-10-06 DIAGNOSIS — R059 Cough, unspecified: Secondary | ICD-10-CM | POA: Insufficient documentation

## 2012-10-06 DIAGNOSIS — F411 Generalized anxiety disorder: Secondary | ICD-10-CM | POA: Insufficient documentation

## 2012-10-06 DIAGNOSIS — Z8679 Personal history of other diseases of the circulatory system: Secondary | ICD-10-CM | POA: Insufficient documentation

## 2012-10-06 DIAGNOSIS — G250 Essential tremor: Secondary | ICD-10-CM | POA: Insufficient documentation

## 2012-10-06 DIAGNOSIS — G252 Other specified forms of tremor: Secondary | ICD-10-CM | POA: Insufficient documentation

## 2012-10-06 DIAGNOSIS — R05 Cough: Secondary | ICD-10-CM | POA: Insufficient documentation

## 2012-10-06 DIAGNOSIS — Z8659 Personal history of other mental and behavioral disorders: Secondary | ICD-10-CM | POA: Insufficient documentation

## 2012-10-06 MED ORDER — IBUPROFEN 800 MG PO TABS
800.0000 mg | ORAL_TABLET | Freq: Once | ORAL | Status: AC
  Administered 2012-10-06: 800 mg via ORAL
  Filled 2012-10-06: qty 1

## 2012-10-06 MED ORDER — LORAZEPAM 1 MG PO TABS
1.0000 mg | ORAL_TABLET | Freq: Once | ORAL | Status: AC
  Administered 2012-10-06: 1 mg via ORAL
  Filled 2012-10-06: qty 1

## 2012-10-06 NOTE — ED Notes (Signed)
Attempted EKG but pt was moved to xray

## 2012-10-06 NOTE — ED Notes (Signed)
Per EMS: pt with URI for the past week, pt c/o chest pain when coughing. Pt also c/o nausea.

## 2012-10-06 NOTE — ED Provider Notes (Signed)
History    CSN: [id number] Arrival date & time 10/06/12  5784 First MD Initiated Contact with Patient 10/06/12 1917      Chief Complaint  Patient presents with  . Cough  . Chest Pain     HPI The patient was at work today when she went out to go out and take a smoke break. While outside she started to become very short of breath and she developed sharp chest pain associated with breathing. Patient states she has been very stressed this past week. She was feeling stressed when  she went to take a smoke break.  Associated with her chest pain and shortness of breath she was having a lot of anxiety and tremulousness.    She has had some congestion and coughing this past week. She has not had any fevers. She denies any leg swelling. She has no history of DVT PE or any cardiac history. Past Medical History  Diagnosis Date  . Depression   . Headache     Past Surgical History  Procedure Date  . Tubal ligation   . Tubal ligation     Family History  Problem Relation Age of Onset  . Other Neg Hx     History  Substance Use Topics  . Smoking status: Current Every Day Smoker -- 0.5 packs/day for 14 years    Types: Cigarettes  . Smokeless tobacco: Never Used  . Alcohol Use: Yes    OB History    Grav Para Term Preterm Abortions TAB SAB Ect Mult Living   14 2 2  12  12   2       Review of Systems  All other systems reviewed and are negative.    Allergies  Coconut oil; Other; and Bee venom  Home Medications   Current Outpatient Rx  Name  Route  Sig  Dispense  Refill  . ALBUTEROL SULFATE HFA 108 (90 BASE) MCG/ACT IN AERS   Inhalation   Inhale 2 puffs into the lungs every 6 (six) hours as needed. For shortness of breath         . ASPIRIN EC 81 MG PO TBEC   Oral   Take 324 mg by mouth once.           BP 117/73  Pulse 84  Temp 98.8 F (37.1 C) (Oral)  Resp 16  SpO2 93%  Physical Exam  Nursing note and vitals reviewed. Constitutional:       Anxious, tearful   HENT:  Head: Normocephalic and atraumatic.  Right Ear: External ear normal.  Left Ear: External ear normal.  Eyes: Conjunctivae normal are normal. Right eye exhibits no discharge. Left eye exhibits no discharge. No scleral icterus.  Neck: Neck supple. No tracheal deviation present.  Cardiovascular: Normal rate, regular rhythm and intact distal pulses.   Pulmonary/Chest: Effort normal and breath sounds normal. No stridor. No respiratory distress. She has no wheezes. She has no rales.  Abdominal: Soft. Bowel sounds are normal. She exhibits no distension. There is no tenderness. There is no rebound and no guarding.  Musculoskeletal: She exhibits no edema and no tenderness.  Neurological: She is alert. She has normal strength. No sensory deficit. Cranial nerve deficit:  no gross defecits noted. She exhibits normal muscle tone. She displays no seizure activity. Coordination normal.  Skin: Skin is warm and dry. No rash noted. She is not diaphoretic.  Psychiatric: Her mood appears anxious.       Crying    ED Course  Procedures (including critical care time) EKG  Rate: 80  Rhythm: normal sinus rhythm  QRS Axis: normal  Intervals: normal  ST/T Wave abnormalities: normal  Conduction Disutrbances:none  Narrative Interpretation:   Old EKG Reviewed: none available  Labs Reviewed - No data to display Dg Chest 2 View  10/06/2012  *RADIOLOGY REPORT*  Clinical Data: Chest pain, shortness of breath, cough  CHEST - 2 VIEW  Comparison: 11/09/2010  Findings: Lungs are clear. No pleural effusion or pneumothorax.  Cardiomediastinal silhouette is within normal limits.  Visualized osseous structures are within normal limits.  IMPRESSION: Normal chest radiographs.   Original Report Authenticated By: Charline Bills, M.D.        MDM  The patient's pain is likely related to a panic attack.  Doubt cardiac etiology.  Doubt PE.  No NA on CXR.  Pt denies SI or HI.  Rec follow up with PCP.          Celene Kras, MD 10/06/12 2036

## 2012-10-06 NOTE — ED Notes (Signed)
Pt sts feeling sick with cough for about a week, pt sts her inhaler is out, pt denies hx of asthma, sts she needs to use inhaler sometimes only.
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
## 2012-11-08 ENCOUNTER — Emergency Department (INDEPENDENT_AMBULATORY_CARE_PROVIDER_SITE_OTHER): Payer: Medicaid Other

## 2012-11-08 ENCOUNTER — Encounter (HOSPITAL_COMMUNITY): Payer: Self-pay | Admitting: Emergency Medicine

## 2012-11-08 ENCOUNTER — Other Ambulatory Visit (HOSPITAL_COMMUNITY)
Admission: RE | Admit: 2012-11-08 | Discharge: 2012-11-08 | Disposition: A | Discharge status: Home / Self Care | Payer: Medicaid Other | Source: Ambulatory Visit | Attending: Emergency Medicine | Admitting: Emergency Medicine

## 2012-11-08 ENCOUNTER — Emergency Department (HOSPITAL_COMMUNITY)
Admission: EM | Admit: 2012-11-08 | Discharge: 2012-11-08 | Disposition: A | Discharge status: Home / Self Care | Payer: Self-pay | Source: Home / Self Care | Attending: Emergency Medicine | Admitting: Emergency Medicine

## 2012-11-08 DIAGNOSIS — J209 Acute bronchitis, unspecified: Secondary | ICD-10-CM

## 2012-11-08 DIAGNOSIS — B9689 Other specified bacterial agents as the cause of diseases classified elsewhere: Secondary | ICD-10-CM

## 2012-11-08 DIAGNOSIS — A499 Bacterial infection, unspecified: Secondary | ICD-10-CM

## 2012-11-08 DIAGNOSIS — N760 Acute vaginitis: Secondary | ICD-10-CM

## 2012-11-08 DIAGNOSIS — Z113 Encounter for screening for infections with a predominantly sexual mode of transmission: Secondary | ICD-10-CM | POA: Insufficient documentation

## 2012-11-08 LAB — POCT I-STAT, CHEM 8
BUN: 5 mg/dL — ABNORMAL LOW (ref 6–23)
Calcium, Ion: 1.17 mmol/L (ref 1.12–1.23)
Chloride: 103 mEq/L (ref 96–112)
Potassium: 3.6 mEq/L (ref 3.5–5.1)

## 2012-11-08 LAB — POCT URINALYSIS DIP (DEVICE)
Bilirubin Urine: NEGATIVE
Glucose, UA: NEGATIVE mg/dL
Nitrite: NEGATIVE
pH: 6.5 (ref 5.0–8.0)

## 2012-11-08 MED ORDER — METRONIDAZOLE 500 MG PO TABS: 500.0000 mg | ORAL_TABLET | Freq: Two times a day (BID) | ORAL | Status: DC

## 2012-11-08 MED ORDER — ONDANSETRON HCL 8 MG PO TABS: 8.0000 mg | ORAL_TABLET | Freq: Three times a day (TID) | ORAL | Status: DC | PRN

## 2012-11-08 MED ORDER — AZITHROMYCIN 250 MG PO TABS: ORAL_TABLET | ORAL | Status: DC

## 2012-11-08 MED ORDER — OMEPRAZOLE 20 MG PO CPDR: 20.0000 mg | DELAYED_RELEASE_CAPSULE | Freq: Two times a day (BID) | ORAL | Status: DC

## 2012-11-08 MED ORDER — ALBUTEROL SULFATE HFA 108 (90 BASE) MCG/ACT IN AERS: 2.0000 | INHALATION_SPRAY | Freq: Four times a day (QID) | RESPIRATORY_TRACT | Status: DC

## 2012-11-08 NOTE — ED Notes (Signed)
Pt is here c/o cold sx x2 weeks Sx include: cough w/yellow mucous and occasional streaks of blood, chest tightness due to cough, SOB, vomiting w/occasional blood, abd pain, nauseas Denies: f/d Took two home pregnancies test and both were positive Hx of BTL  She is alert and responsive w/no signs of acute distress.

## 2012-11-08 NOTE — ED Provider Notes (Signed)
Chief Complaint:   Chief Complaint  Patient presents with  . URI  . Possible Pregnancy    History of Present Illness:   Joyce Klein is a 27 year old female who presents with cough, vomiting of blood, and vaginal discharge. The cough has been going on for 2 weeks and is productive of yellow, bloody sputum. She feels some shortness of breath, chest pain, wheezing, nasal congestion, sore throat, and rhinorrhea. She denies any fever or chills. The vomiting is also been going on for about 2 weeks. She vomited up small amounts of blood which she estimates to be a total of about a cup she has some both upper and lower bowel pain. She has a history of GERD. There is no diarrhea, melena, or hematochezia. She also notes some urinary frequency and older. She has had urinary tract infections before. She's also had a two-day history of discharge and older. Last menstrual period was March 7. She had a bilateral tubal ligation but then had 2 miscarriages since then. She is sexually active.  Review of Systems:  Other than noted above, the patient denies any of the following symptoms: Systemic:  No fevers, chills, sweats, weight loss or gain, fatigue, or tiredness. Eye:  No redness or discharge. ENT:  No ear pain, drainage, headache, nasal congestion, drainage, sinus pressure, difficulty swallowing, or sore throat. Neck:  No neck pain or swollen glands. Lungs:  No cough, sputum production, hemoptysis, wheezing, chest tightness, shortness of breath or chest pain. GI:  No abdominal pain, nausea, vomiting or diarrhea.  PMFSH:  Past medical history, family history, social history, meds, and allergies were reviewed. No medication allergies, she is on amoxicillin for dental infection, she has a history of depression. She's also had a half pack of cigarettes a day, she took a friend's Percocet last night for pain.  Physical Exam:   Vital signs:  BP 103/61  Pulse 77  Temp(Src) 97.3 F (36.3 C) (Oral)  Resp 16   SpO2 100%  LMP 11/03/2012 General:  Alert and oriented.  In no distress.  Skin warm and dry. Eye:  No conjunctival injection or drainage. Lids were normal. ENT:  TMs and canals were normal, without erythema or inflammation.  Nasal mucosa was clear and uncongested, without drainage.  Mucous membranes were moist.  Pharynx was clear with no exudate or drainage.  There were no oral ulcerations or lesions. Neck:  Supple, no adenopathy, tenderness or mass. Lungs:  No respiratory distress.  Lungs were clear to auscultation, without wheezes, rales or rhonchi.  Breath sounds were clear and equal bilaterally.  Heart:  Regular rhythm, without gallops, murmers or rubs. Abdomen: Soft, nontender, no organomegaly or mass. No CVA tenderness. Pelvic: Normal external genitalia, vaginal mucosa was normal, there was a thick, white, malodorous discharge. Today she has mild cervical motion pain. Uterus is normal in size and shape and nontender. She has mild bilateral adnexal pain. Skin:  Clear, warm, and dry, without rash or lesions.   Labs:   Results for orders placed during the hospital encounter of 11/08/12  POCT URINALYSIS DIP (DEVICE)      Result Value Range   Glucose, UA NEGATIVE  NEGATIVE mg/dL   Bilirubin Urine NEGATIVE  NEGATIVE   Ketones, ur NEGATIVE  NEGATIVE mg/dL   Specific Gravity, Urine <=1.005  1.005 - 1.030   Hgb urine dipstick MODERATE (*) NEGATIVE   pH 6.5  5.0 - 8.0   Protein, ur NEGATIVE  NEGATIVE mg/dL   Urobilinogen, UA 0.2  0.0 - 1.0 mg/dL   Nitrite NEGATIVE  NEGATIVE   Leukocytes, UA TRACE (*) NEGATIVE  POCT PREGNANCY, URINE      Result Value Range   Preg Test, Ur NEGATIVE  NEGATIVE  POCT I-STAT, CHEM 8      Result Value Range   Sodium 141  135 - 145 mEq/L   Potassium 3.6  3.5 - 5.1 mEq/L   Chloride 103  96 - 112 mEq/L   BUN 5 (*) 6 - 23 mg/dL   Creatinine, Ser 1.61  0.50 - 1.10 mg/dL   Glucose, Bld 99  70 - 99 mg/dL   Calcium, Ion 0.96  0.45 - 1.23 mmol/L   TCO2 26  0 - 100  mmol/L   Hemoglobin 15.3 (*) 12.0 - 15.0 g/dL   HCT 40.9  81.1 - 91.4 %    Other Labs Obtained at Urgent Care Center:  DNA probes for gonorrhea, Chlamydia, Trichomonas, Gardnerella, Candida were obtained as well as a urine culture.  Results are pending at this time and we will call about any positive results.   Radiology:  Dg Chest 2 View  11/08/2012  *RADIOLOGY REPORT*  Clinical Data: Cough and hemoptysis for 2 weeks  CHEST - 2 VIEW  Comparison: Prior chest x-ray 10/06/2012  Findings: The lungs are well-aerated and free from pulmonary edema, focal airspace consolidation or pulmonary nodule.  Cardiac and mediastinal contours are within normal limits.  No pneumothorax, or pleural effusion. No acute osseous findings.  IMPRESSION:  No acute cardiopulmonary disease.   Original Report Authenticated By: Malachy Moan, M.D.     Assessment:  The primary encounter diagnosis was Acute bronchitis. Diagnoses of Gastritis and Bacterial vaginosis were also pertinent to this visit.  Her cough and hemoptysis appears to be due to bronchitis. She was strongly encouraged to quit smoking. The vomiting of blood may be due to a mild gastritis. She was given omeprazole and suggested she followup with Dr. Leone Payor. Finally she appears to have bacterial vaginosis. Cultures are still pending for other pathogens and we'll call her back about any positive results.  Plan:   1.  The following meds were prescribed:   Discharge Medication List as of 11/08/2012  3:53 PM    START taking these medications   Details  !! albuterol (PROVENTIL HFA;VENTOLIN HFA) 108 (90 BASE) MCG/ACT inhaler Inhale 2 puffs into the lungs 4 (four) times daily., Starting 11/08/2012, Until Discontinued, Normal    azithromycin (ZITHROMAX Z-PAK) 250 MG tablet Take as directed., Normal    metroNIDAZOLE (FLAGYL) 500 MG tablet Take 1 tablet (500 mg total) by mouth 2 (two) times daily., Starting 11/08/2012, Until Discontinued, Normal    omeprazole  (PRILOSEC) 20 MG capsule Take 1 capsule (20 mg total) by mouth 2 (two) times daily before a meal., Starting 11/08/2012, Until Discontinued, Normal    ondansetron (ZOFRAN) 8 MG tablet Take 1 tablet (8 mg total) by mouth every 8 (eight) hours as needed for nausea., Starting 11/08/2012, Until Discontinued, Normal     !! - Potential duplicate medications found. Please discuss with provider.     2.  The patient was instructed in symptomatic care and handouts were given. 3.  The patient was told to return if becoming worse in any way, if no better in 3 or 4 days, and given some red flag symptoms such as worsening pain, worsening hemoptysis or hematemesis, melena, and intractable vomiting, fever, chills, difficulty breathing, or worsening pelvic pain that would indicate earlier return.  Reuben Likes, MD 11/08/12 [phone]

## 2012-11-09 LAB — URINE CULTURE

## 2012-11-10 ENCOUNTER — Telehealth (HOSPITAL_COMMUNITY): Payer: Self-pay | Admitting: Emergency Medicine

## 2012-11-10 MED ORDER — FLUCONAZOLE 150 MG PO TABS: 150.0000 mg | ORAL_TABLET | Freq: Once | ORAL | Status: DC

## 2012-11-10 NOTE — Telephone Encounter (Signed)
Message copied by Reuben Likes on Fri Nov 10, 2012  6:48 PM ------      Message from: Vassie Moselle      Created: Fri Nov 10, 2012  3:48 PM       Candida and Gardnerella pos. All the rest were neg.  You treated with Flagyl. What  Would you like to do with the Candida?      Vassie Moselle      11/10/2012       ------

## 2012-11-10 NOTE — ED Notes (Signed)
The patient's DNA probe came back positive for Gardnerella and Candida. She was treated with Flagyl but nothing for the Candida, so I will send in a prescription for Diflucan 150 mg as a single dose for treatment of the Candida.  Reuben Likes, MD 11/10/12 [phone]

## 2012-11-12 ENCOUNTER — Telehealth (HOSPITAL_COMMUNITY): Payer: Self-pay | Admitting: *Deleted

## 2012-11-12 NOTE — ED Notes (Signed)
3/14  GC/Chlamydia neg, Affirm: Candida and Gardnerella pos., Trich neg., Urine culture: No growth.  Pt. adequately treated with Flagyl for bacterial vaginosis.  Message sent to Dr. Lorenz Coaster and he e-prescribed Diflucan for the Candida to the [facility] on [facility].  3/16 I called pt. Pt. verified x 2 and given results.  Pt. given this information and she is taking the Flagyl and will get the Diflucan. Vassie Moselle 11/12/2012

## 2013-02-24 ENCOUNTER — Emergency Department (HOSPITAL_COMMUNITY): Payer: Medicaid Other

## 2013-02-24 ENCOUNTER — Encounter (HOSPITAL_COMMUNITY): Payer: Self-pay | Admitting: Physical Medicine and Rehabilitation

## 2013-02-24 ENCOUNTER — Emergency Department (HOSPITAL_COMMUNITY)
Admission: EM | Admit: 2013-02-24 | Discharge: 2013-02-24 | Disposition: A | Discharge status: Home / Self Care | Payer: Medicaid Other | Attending: Emergency Medicine | Admitting: Emergency Medicine

## 2013-02-24 DIAGNOSIS — Z9851 Tubal ligation status: Secondary | ICD-10-CM | POA: Insufficient documentation

## 2013-02-24 DIAGNOSIS — R197 Diarrhea, unspecified: Secondary | ICD-10-CM | POA: Insufficient documentation

## 2013-02-24 DIAGNOSIS — F17200 Nicotine dependence, unspecified, uncomplicated: Secondary | ICD-10-CM | POA: Insufficient documentation

## 2013-02-24 DIAGNOSIS — R109 Unspecified abdominal pain: Secondary | ICD-10-CM | POA: Insufficient documentation

## 2013-02-24 DIAGNOSIS — R112 Nausea with vomiting, unspecified: Secondary | ICD-10-CM | POA: Insufficient documentation

## 2013-02-24 DIAGNOSIS — R1013 Epigastric pain: Secondary | ICD-10-CM

## 2013-02-24 DIAGNOSIS — Z8659 Personal history of other mental and behavioral disorders: Secondary | ICD-10-CM | POA: Insufficient documentation

## 2013-02-24 DIAGNOSIS — Z79899 Other long term (current) drug therapy: Secondary | ICD-10-CM | POA: Insufficient documentation

## 2013-02-24 LAB — URINALYSIS, ROUTINE W REFLEX MICROSCOPIC
Ketones, ur: 40 mg/dL — AB
Protein, ur: NEGATIVE mg/dL
Urobilinogen, UA: 0.2 mg/dL (ref 0.0–1.0)

## 2013-02-24 LAB — CBC WITH DIFFERENTIAL/PLATELET
Basophils Absolute: 0 10*3/uL (ref 0.0–0.1)
Eosinophils Relative: 1 % (ref 0–5)
HCT: 43.7 % (ref 36.0–46.0)
Hemoglobin: 15.4 g/dL — ABNORMAL HIGH (ref 12.0–15.0)
Lymphocytes Relative: 13 % (ref 12–46)
Lymphs Abs: 1.3 10*3/uL (ref 0.7–4.0)
MCV: 94.6 fL (ref 78.0–100.0)
Monocytes Absolute: 0.5 10*3/uL (ref 0.1–1.0)
Monocytes Relative: 5 % (ref 3–12)
RDW: 12.2 % (ref 11.5–15.5)
WBC: 10.1 10*3/uL (ref 4.0–10.5)

## 2013-02-24 LAB — URINE MICROSCOPIC-ADD ON

## 2013-02-24 LAB — COMPREHENSIVE METABOLIC PANEL
BUN: 10 mg/dL (ref 6–23)
CO2: 23 mEq/L (ref 19–32)
Calcium: 9.2 mg/dL (ref 8.4–10.5)
Chloride: 103 mEq/L (ref 96–112)
Creatinine, Ser: 0.65 mg/dL (ref 0.50–1.10)
GFR calc Af Amer: >90 mL/min (ref >90)
GFR calc non Af Amer: >90 mL/min (ref >90)
Glucose, Bld: 122 mg/dL — ABNORMAL HIGH (ref 70–99)
Total Bilirubin: 0.4 mg/dL (ref 0.3–1.2)

## 2013-02-24 LAB — POCT PREGNANCY, URINE: Preg Test, Ur: NEGATIVE

## 2013-02-24 MED ORDER — OMEPRAZOLE 20 MG PO CPDR: 40.0000 mg | DELAYED_RELEASE_CAPSULE | Freq: Two times a day (BID) | ORAL | Status: DC

## 2013-02-24 MED ORDER — ONDANSETRON HCL 4 MG PO TABS: 4.0000 mg | ORAL_TABLET | Freq: Four times a day (QID) | ORAL | Status: DC

## 2013-02-24 MED ORDER — SODIUM CHLORIDE 0.9 % IV BOLUS (SEPSIS)
1000.0000 mL | Freq: Once | INTRAVENOUS | Status: AC
  Administered 2013-02-24: 1000 mL via INTRAVENOUS

## 2013-02-24 MED ORDER — KETOROLAC TROMETHAMINE 30 MG/ML IJ SOLN
30.0000 mg | Freq: Once | INTRAMUSCULAR | Status: AC
  Administered 2013-02-24: 30 mg via INTRAVENOUS
  Filled 2013-02-24: qty 1

## 2013-02-24 MED ORDER — GI COCKTAIL ~~LOC~~
30.0000 mL | Freq: Once | ORAL | Status: AC
  Administered 2013-02-24: 30 mL via ORAL
  Filled 2013-02-24: qty 30

## 2013-02-24 MED ORDER — MORPHINE SULFATE 4 MG/ML IJ SOLN
4.0000 mg | Freq: Once | INTRAMUSCULAR | Status: AC
  Administered 2013-02-24: 4 mg via INTRAVENOUS
  Filled 2013-02-24: qty 1

## 2013-02-24 MED ORDER — FAMOTIDINE IN NACL 20-0.9 MG/50ML-% IV SOLN
20.0000 mg | INTRAVENOUS | Status: AC
  Administered 2013-02-24: 20 mg via INTRAVENOUS
  Filled 2013-02-24: qty 50

## 2013-02-24 MED ORDER — ONDANSETRON HCL 4 MG/2ML IJ SOLN
4.0000 mg | INTRAMUSCULAR | Status: AC
  Administered 2013-02-24: 4 mg via INTRAVENOUS
  Filled 2013-02-24: qty 2

## 2013-02-24 MED ORDER — IOHEXOL 300 MG/ML  SOLN
100.0000 mL | Freq: Once | INTRAMUSCULAR | Status: AC | PRN
  Administered 2013-02-24: 100 mL via INTRAVENOUS

## 2013-02-24 MED ORDER — HYDROCODONE-ACETAMINOPHEN 5-325 MG PO TABS: 1.0000 | ORAL_TABLET | ORAL | Status: DC | PRN

## 2013-02-24 NOTE — ED Notes (Signed)
Brought pt a cup of ice upon approval from EDP.

## 2013-02-24 NOTE — ED Provider Notes (Signed)
History    CSN: [id number] Arrival date & time 02/24/13  1522  First MD Initiated Contact with Patient 02/24/13 1608     Chief Complaint  Patient presents with  . Emesis  . Abdominal Pain   (Consider location/radiation/quality/duration/timing/severity/associated sxs/prior Treatment) HPI Comments: Patient is a 27 year old female with no significant past medical history who presents for nausea with NB/NB emesis and associated abdominal pain. Patient states that symptoms began is intermittent nausea and emesis for the past week without any other symptoms. Abdominal pain began 12 hours ago on her left side and in her epigastric region that she describes to be a constant ache with intermittent sharp sensations, nonradiating, worse with certain movements and when coughing, and improvement in the fetal position. Patient admits to multiple episodes of emesis today. She also admits to having a bowel movement this morning which was normal in color and slightly watery consistency without blood. Patient denies fevers, chest pain, shortness of breath, melena or hematochezia, dysuria or hematuria, vaginal pain or discharge, and numbness or tingling in her extremities. Patient denies a history of abdominal surgery.  Patient is a 27 y.o. female presenting with vomiting and abdominal pain. The history is provided by the patient. No language interpreter was used.  Emesis Associated symptoms: abdominal pain   Abdominal Pain Associated symptoms include abdominal pain, nausea and vomiting. Pertinent negatives include no chest pain, fever, numbness, rash or weakness.   Past Medical History  Diagnosis Date  . Depression   . [id number](478.2)    Past Surgical History  Procedure Laterality Date  . Tubal ligation    . Tubal ligation     Family History  Problem Relation Age of Onset  . Other Neg Hx    History  Substance Use Topics  . Smoking status: Current Every Day Smoker -- 0.50 packs/day for 14 years      Types: Cigarettes  . Smokeless tobacco: Never Used  . Alcohol Use: Yes   OB History   Grav Para Term Preterm Abortions TAB SAB Ect Mult Living   14 2 2  12  12   2      Review of Systems  Constitutional: Negative for fever.  Respiratory: Negative for shortness of breath.   Cardiovascular: Negative for chest pain.  Gastrointestinal: Positive for nausea, vomiting and abdominal pain. Negative for blood in stool.  Genitourinary: Positive for flank pain. Negative for dysuria and hematuria.  Skin: Negative for rash.  Neurological: Negative for weakness and numbness.  All other systems reviewed and are negative.   Allergies  Coconut oil; Other; and Bee venom  Home Medications   Current Outpatient Rx  Name  Route  Sig  Dispense  Refill  . albuterol (PROVENTIL HFA;VENTOLIN HFA) 108 (90 BASE) MCG/ACT inhaler   Inhalation   Inhale 2 puffs into the lungs every 6 (six) hours as needed. For shortness of breath         . aspirin-acetaminophen-caffeine (EXCEDRIN MIGRAINE) 250-250-65 MG per tablet   Oral   Take 2 tablets by mouth daily as needed (for migraine).         Marland Kitchen HYDROcodone-acetaminophen (NORCO/VICODIN) 5-325 MG per tablet   Oral   Take 1 tablet by mouth every 4 (four) hours as needed for pain.   20 tablet   0   . omeprazole (PRILOSEC) 20 MG capsule   Oral   Take 2 capsules (40 mg total) by mouth 2 (two) times daily.   60 capsule   0   .  ondansetron (ZOFRAN) 4 MG tablet   Oral   Take 1 tablet (4 mg total) by mouth every 6 (six) hours.   12 tablet   0    BP 102/69  Pulse 63  Temp(Src) 99.1 F (37.3 C) (Oral)  Resp 16  SpO2 98%  LMP 02/10/2013 Physical Exam  Nursing note and vitals reviewed. Constitutional: She appears well-developed and well-nourished. No distress.  HENT:  Head: Normocephalic and atraumatic.  Mouth/Throat: Oropharynx is clear and moist. No oropharyngeal exudate.  Eyes: Conjunctivae and EOM are normal. Pupils are equal, round, and  reactive to light. No scleral icterus.  Neck: Normal range of motion.  Cardiovascular: Normal rate, regular rhythm and normal heart sounds.   Pulmonary/Chest: Effort normal and breath sounds normal. No respiratory distress. She has no wheezes. She has no rales.  Abdominal: Soft. Bowel sounds are normal. She exhibits no distension, no pulsatile midline mass and no mass. There is tenderness in the epigastric area, left upper quadrant and left lower quadrant. There is CVA tenderness (L CVA TTP). There is no rebound, no guarding, no tenderness at McBurney's point and negative Murphy's sign.    Musculoskeletal: Normal range of motion. She exhibits no edema.  Neurological: She is alert.  Skin: Skin is warm and dry. No rash noted. She is not diaphoretic. No erythema. No pallor.  Psychiatric: She has a normal mood and affect. Her behavior is normal.    ED Course  Procedures (including critical care time) Labs Reviewed  CBC WITH DIFFERENTIAL - Abnormal; Notable for the following:    Hemoglobin 15.4 (*)    Neutrophils Relative % 81 (*)    Neutro Abs 8.2 (*)    All other components within normal limits  COMPREHENSIVE METABOLIC PANEL - Abnormal; Notable for the following:    Glucose, Bld 122 (*)    All other components within normal limits  URINALYSIS, ROUTINE W REFLEX MICROSCOPIC - Abnormal; Notable for the following:    Color, Urine AMBER (*)    APPearance TURBID (*)    Bilirubin Urine SMALL (*)    Ketones, ur 40 (*)    Leukocytes, UA SMALL (*)    All other components within normal limits  LIPASE, BLOOD  URINE MICROSCOPIC-ADD ON  POCT PREGNANCY, URINE   Ct Abdomen Pelvis W Contrast  02/24/2013   *RADIOLOGY REPORT*  Clinical Data: Left-sided abdominal pain, emesis  CT ABDOMEN AND PELVIS WITH CONTRAST  Technique:  Multidetector CT imaging of the abdomen and pelvis was performed following the standard protocol during bolus administration of intravenous contrast.  Contrast: OMNIPAQUE  IOHEXOL 300 MG/ML  SOLN  Comparison: 01/13/2010; pelvic ultrasound - 04/26/2012  Findings:  Normal hepatic contour.  There is a minimal amount of focal fatty infiltration adjacent to the fissure for the ligamentum teres. Normal appearance of the gallbladder.  No intra or extrahepatic biliary duct dilatation.  No ascites.  There is symmetric enhancement and excretion of the bilateral kidneys.  No definite renal stones in the postcontrast examination. No discrete renal lesion.  No urinary obstruction or perinephric stranding. The urinary bladder is decompressed.  Normal appearance of the bilateral adrenal glands, pancreas and spleen.  The bowel is normal in course and caliber without wall thickening or evidence of obstruction.  The appendix is not visualized, however there is no inflammatory change in the right lower abdominal quadrant.  No pneumoperitoneum, pneumatosis or portal venous gas.  Normal caliber of the abdominal aorta.  The major branch vessels of the abdominal aorta  appear patent on this non CT examination.  No definite retroperitoneal, mesenteric, pelvic or inguinal lymphadenopathy.  Incidental note is made of an approximately 1.5 x 1.0 cm peripherally hyperattenuating right-sided adnexal cyst (image 66, series two).  There is a minimal amount of fluid within the endometrial canal.  The uterus is anteverted.  No discrete adnexal lesions.  Note is made of minimal amount of retrograde filling into a mildly hypertrophied left sided gonadal vein with filling of several left-sided adnexal venous collaterals.  There is a trace amount of fluid within the pelvic cul-de-sac, presumably physiologic.  Limited visualization of the lower thorax is negative for focal airspace opacity or pleural effusion.  Normal heart size.  No pericardial effusion.  No acute or aggressive osseous abnormalities.  IMPRESSION:  1.  No explanation for patient's left sided abdominal pain.  2.   Retrograde filling into a mildly  hypertrophied left sided gonadal vein with filling of several hypertrophied left-sided pelvic venous collaterals, nonspecific but could be seen in the setting of pelvic venous congestion.  Clinical correlation is advised.  3.  Approximately 1.5 cm right-sided cystic adnexal lesion, presumably physiologic.  This finding associated with a small amount of presumably physiologic fluid within the endometrial canal and small amount of free fluid within the pelvic cul-de-sac.   Original Report Authenticated By: Tacey Ruiz, MD    1. Abdominal pain   2. Epigastric pain     MDM  27 y/o female who presents for abdominal pain. No significant PMH or hx of abdominal surgeries. Physical exam as above with TTP in epigastrium, LUQ, and LLQ. Patient with NB/NB emesis and episode of watery diarrhea today. Will obtain CBC, CMP, lipase, UA, Upreg, and CT abdomen pelvis with contrast to further evaluate symptoms. IVF, pepcid, and GI cocktail ordered for symptoms to evaluate for possible gastritis/gastric ulcer etiology.  No improvement in pain with pepcid and GI coctail. Morphine ordered.  Pain well controlled with morphine. Labs without leukocytosis, anemia, hemoconcentration, electrolyte imbalance or abnormal liver or kidney function. Lipase normal and UA without evidence of UTI. CT pending.  CT without acute intraabdominal changes to account for L sided pain. There is some evidence of pelvic venous congestion; however have spoken with radiologist who states this is often non-concerning and that findings are nonspecific. Findings do require f/u with OBGYN. Results reviewed with patient who verbalizes understanding. She has remained well and nontoxic appearing, afebrile, and hemodynamically stable in ED without emesis. Patient appropriate for d/c with PCP and OBGYN follow up. Zofran, Norco, and prilosec given for symptom management. Indications for ED return discussed with patient who verbalizes comfort and understanding  with plan.    Antony Madura, PA-C 03/01/13 1756

## 2013-02-24 NOTE — ED Notes (Signed)
Pt presents to department for evaluation of epigastric pain and nausea/vomiting and diarrhea. Ongoing x2 weeks. 9/10 pain at the time. Pt is alert and oriented x4. No signs of distress noted.

## 2013-02-24 NOTE — ED Notes (Signed)
Pt complaining of mid abdominal pain that radiates to the left side. States pain started around 0400 today. States that she has vaginal discharge that is creamy white in color and consistency. Denies dysuria. Reports nausea and vomiting with the abdominal pain.

## 2013-03-02 NOTE — ED Provider Notes (Signed)
Medical screening examination/treatment/procedure(s) were performed by non-physician practitioner and as supervising physician I was immediately available for consultation/collaboration.   Yvetta Drotar, MD 03/02/13 0708 

## 2013-03-03 ENCOUNTER — Encounter (HOSPITAL_COMMUNITY): Payer: Self-pay | Admitting: Emergency Medicine

## 2013-03-03 ENCOUNTER — Emergency Department (HOSPITAL_COMMUNITY)
Admission: EM | Admit: 2013-03-03 | Discharge: 2013-03-03 | Disposition: A | Discharge status: Home / Self Care | Payer: Medicaid Other | Attending: Emergency Medicine | Admitting: Emergency Medicine

## 2013-03-03 DIAGNOSIS — R197 Diarrhea, unspecified: Secondary | ICD-10-CM | POA: Insufficient documentation

## 2013-03-03 DIAGNOSIS — Z79899 Other long term (current) drug therapy: Secondary | ICD-10-CM | POA: Insufficient documentation

## 2013-03-03 DIAGNOSIS — R112 Nausea with vomiting, unspecified: Secondary | ICD-10-CM | POA: Insufficient documentation

## 2013-03-03 DIAGNOSIS — R109 Unspecified abdominal pain: Secondary | ICD-10-CM

## 2013-03-03 DIAGNOSIS — F17200 Nicotine dependence, unspecified, uncomplicated: Secondary | ICD-10-CM | POA: Insufficient documentation

## 2013-03-03 DIAGNOSIS — F329 Major depressive disorder, single episode, unspecified: Secondary | ICD-10-CM | POA: Insufficient documentation

## 2013-03-03 DIAGNOSIS — F3289 Other specified depressive episodes: Secondary | ICD-10-CM | POA: Insufficient documentation

## 2013-03-03 DIAGNOSIS — R1084 Generalized abdominal pain: Secondary | ICD-10-CM | POA: Insufficient documentation

## 2013-03-03 DIAGNOSIS — Z7982 Long term (current) use of aspirin: Secondary | ICD-10-CM | POA: Insufficient documentation

## 2013-03-03 DIAGNOSIS — Z3202 Encounter for pregnancy test, result negative: Secondary | ICD-10-CM | POA: Insufficient documentation

## 2013-03-03 LAB — COMPREHENSIVE METABOLIC PANEL
ALT: 9 U/L (ref 0–35)
AST: 13 U/L (ref 0–37)
Alkaline Phosphatase: 55 U/L (ref 39–117)
CO2: 23 mEq/L (ref 19–32)
Calcium: 9 mg/dL (ref 8.4–10.5)
Chloride: 103 mEq/L (ref 96–112)
GFR calc non Af Amer: >90 mL/min (ref >90)
Glucose, Bld: 104 mg/dL — ABNORMAL HIGH (ref 70–99)
Potassium: 3.4 mEq/L — ABNORMAL LOW (ref 3.5–5.1)
Sodium: 139 mEq/L (ref 135–145)

## 2013-03-03 LAB — URINALYSIS, ROUTINE W REFLEX MICROSCOPIC
Bilirubin Urine: NEGATIVE
Glucose, UA: NEGATIVE mg/dL
Hgb urine dipstick: NEGATIVE
Protein, ur: NEGATIVE mg/dL
Specific Gravity, Urine: 1.024 (ref 1.005–1.030)

## 2013-03-03 LAB — CBC WITH DIFFERENTIAL/PLATELET
Basophils Absolute: 0 10*3/uL (ref 0.0–0.1)
Eosinophils Relative: 1 % (ref 0–5)
Lymphocytes Relative: 31 % (ref 12–46)
Lymphs Abs: 2.5 10*3/uL (ref 0.7–4.0)
Neutro Abs: 4.8 10*3/uL (ref 1.7–7.7)
Neutrophils Relative %: 61 % (ref 43–77)
Platelets: 186 10*3/uL (ref 150–400)
RBC: 4.39 MIL/uL (ref 3.87–5.11)
RDW: 12.3 % (ref 11.5–15.5)
WBC: 7.9 10*3/uL (ref 4.0–10.5)

## 2013-03-03 LAB — URINE MICROSCOPIC-ADD ON

## 2013-03-03 LAB — POCT PREGNANCY, URINE: Preg Test, Ur: NEGATIVE

## 2013-03-03 MED ORDER — FENTANYL CITRATE 0.05 MG/ML IJ SOLN
50.0000 ug | Freq: Once | INTRAMUSCULAR | Status: AC
  Administered 2013-03-03: 50 ug via INTRAVENOUS

## 2013-03-03 MED ORDER — ONDANSETRON HCL 4 MG PO TABS: 4.0000 mg | ORAL_TABLET | Freq: Three times a day (TID) | ORAL | Status: DC | PRN

## 2013-03-03 MED ORDER — OXYCODONE-ACETAMINOPHEN 5-325 MG PO TABS: ORAL_TABLET | ORAL | Status: DC

## 2013-03-03 MED ORDER — OXYCODONE-ACETAMINOPHEN 5-325 MG PO TABS
2.0000 | ORAL_TABLET | Freq: Once | ORAL | Status: AC
  Administered 2013-03-03: 2 via ORAL
  Filled 2013-03-03: qty 2

## 2013-03-03 MED ORDER — ONDANSETRON HCL 4 MG/2ML IJ SOLN
4.0000 mg | Freq: Once | INTRAMUSCULAR | Status: AC
  Administered 2013-03-03: 4 mg via INTRAVENOUS
  Filled 2013-03-03: qty 2

## 2013-03-03 MED ORDER — FENTANYL CITRATE 0.05 MG/ML IJ SOLN
50.0000 ug | Freq: Once | INTRAMUSCULAR | Status: AC
  Administered 2013-03-03: 50 ug via INTRAVENOUS
  Filled 2013-03-03: qty 2

## 2013-03-03 NOTE — ED Provider Notes (Signed)
History    CSN: [id number] Arrival date & time 03/03/13  1448  First MD Initiated Contact with Patient 03/03/13 1518     Chief Complaint  Patient presents with  . Abdominal Pain   (Consider location/radiation/quality/duration/timing/severity/associated sxs/prior Treatment) HPI Comments: 27 y.o. Female with no significant PMHx presents today with symptoms similar to those she presented with on 02/24/13 also in the ED where she complains of generalized abdominal pain, nausea, vomiting, and diarrhea. Pt states her abdominal pain is sharp, generalized, constant, localized, worse with movements. She states she has been lying in bed since the 28th and has not followed up as instructed either with [facility] or with OBGYN. Pt states she just continues to feel abdominal pain and nausea. She tries to eat crackers, jello, and vomits promptly. CT scan on 02/24/13 found no correlation for her pain which at that time was more left sided.   Patient is a 27 y.o. female presenting with abdominal pain.  Abdominal Pain Associated symptoms include abdominal pain, nausea and vomiting. Pertinent negatives include no chest pain, diaphoresis, fever, headaches, neck pain, numbness, rash or weakness.   Past Medical History  Diagnosis Date  . Depression   . [id number](478.2)    Past Surgical History  Procedure Laterality Date  . Tubal ligation    . Tubal ligation     Family History  Problem Relation Age of Onset  . Other Neg Hx    History  Substance Use Topics  . Smoking status: Current Every Day Smoker -- 0.50 packs/day for 14 years    Types: Cigarettes  . Smokeless tobacco: Never Used  . Alcohol Use: Yes   OB History   Grav Para Term Preterm Abortions TAB SAB Ect Mult Living   14 2 2  12  12   2      Review of Systems  Constitutional: Negative for fever and diaphoresis.  HENT: Negative for neck pain and neck stiffness.   Eyes: Negative for visual disturbance.  Respiratory: Negative for apnea, chest  tightness and shortness of breath.   Cardiovascular: Negative for chest pain and palpitations.  Gastrointestinal: Positive for nausea, vomiting, abdominal pain and diarrhea. Negative for constipation and blood in stool.       Generalized  Genitourinary: Negative for dysuria, frequency, hematuria and pelvic pain.  Musculoskeletal: Negative for back pain and gait problem.  Skin: Negative for rash.  Neurological: Negative for dizziness, weakness, light-headedness, numbness and headaches.    Allergies  Coconut oil; Other; and Bee venom  Home Medications   Current Outpatient Rx  Name  Route  Sig  Dispense  Refill  . albuterol (PROVENTIL HFA;VENTOLIN HFA) 108 (90 BASE) MCG/ACT inhaler   Inhalation   Inhale 2 puffs into the lungs every 6 (six) hours as needed. For shortness of breath         . aspirin-acetaminophen-caffeine (EXCEDRIN MIGRAINE) 250-250-65 MG per tablet   Oral   Take 2 tablets by mouth daily as needed (for migraine).         Marland Kitchen HYDROcodone-acetaminophen (NORCO/VICODIN) 5-325 MG per tablet   Oral   Take 1 tablet by mouth every 4 (four) hours as needed for pain.   20 tablet   0   . omeprazole (PRILOSEC) 20 MG capsule   Oral   Take 2 capsules (40 mg total) by mouth 2 (two) times daily.   60 capsule   0   . ondansetron (ZOFRAN) 4 MG tablet   Oral   Take 1 tablet (  4 mg total) by mouth every 6 (six) hours.   12 tablet   0    BP 103/68  Pulse 107  Temp(Src) 97.8 F (36.6 C) (Oral)  Resp 18  SpO2 97%  LMP 02/10/2013 Physical Exam  Nursing note and vitals reviewed. Constitutional: She is oriented to person, place, and time. She appears well-developed and well-nourished. No distress.  uncomfortable  HENT:  Head: Normocephalic and atraumatic.  Eyes: Conjunctivae and EOM are normal.  Neck: Normal range of motion. Neck supple.  No meningeal signs  Cardiovascular: Normal rate, regular rhythm and normal heart sounds.  Exam reveals no gallop and no friction rub.    No murmur heard. Pulmonary/Chest: Effort normal and breath sounds normal. No respiratory distress. She has no wheezes. She has no rales. She exhibits no tenderness.  Abdominal: Soft. Bowel sounds are normal. She exhibits no distension. There is generalized tenderness. There is no rebound, no guarding, no CVA tenderness, no tenderness at McBurney's point and negative Murphy's sign.    No pain at [facility]  Musculoskeletal: Normal range of motion. She exhibits no edema and no tenderness.  FROM to upper and lower extremities  Neurological: She is alert and oriented to person, place, and time. No cranial nerve deficit.  Speech is clear and goal oriented, follows commands Sensation normal to light touch and two point discrimination Moves extremities without ataxia, coordination intact Normal gait and balance Normal strength in upper and lower extremities bilaterally including dorsiflexion and plantar flexion, strong and equal grip strength   Skin: Skin is warm and dry. She is not diaphoretic. No erythema.  Psychiatric:  anxious    ED Course  Procedures (including critical care time) Labs Reviewed  COMPREHENSIVE METABOLIC PANEL - Abnormal; Notable for the following:    Potassium 3.4 (*)    Glucose, Bld 104 (*)    All other components within normal limits  URINALYSIS, ROUTINE W REFLEX MICROSCOPIC - Abnormal; Notable for the following:    Color, Urine AMBER (*)    APPearance CLOUDY (*)    Ketones, ur 15 (*)    Leukocytes, UA TRACE (*)    All other components within normal limits  URINE MICROSCOPIC-ADD ON - Abnormal; Notable for the following:    Squamous Epithelial / LPF FEW (*)    All other components within normal limits  CBC WITH DIFFERENTIAL  LIPASE, BLOOD  POCT PREGNANCY, URINE   No results found. 1. Abdominal pain    Discharge Medication List as of 03/03/2013  6:20 PM    START taking these medications   Details  !! ondansetron (ZOFRAN) 4 MG tablet Take 1 tablet (4  mg total) by mouth every 8 (eight) hours as needed for nausea., Starting 03/03/2013, Until Discontinued, Print    oxyCODONE-acetaminophen (PERCOCET/ROXICET) 5-325 MG per tablet Take one or two tablets by mouth every 4 to 6 hours as needed for pain, Print     !! - Potential duplicate medications found. Please discuss with provider.       MDM  Patient is afebrile, nontoxic, nonseptic appearing, in no apparent distress, although she appears uncomfortable.  On physical exam, as I listened to bowel sounds with my stethocscope, pushing into her belly, pt did not appear tender. When I removed my stethoscope, and pushed with my hands, pt became uncomfortable and jumped in the bed. Pt is reporting no new sx since last week. No fevers, no bloody emesis or bloody diarrhea. Pt does not appear to have a surgical abdomen and  there are no peritoneal signs.  No indication of appendicitis, bowel obstruction, bowel perforation, cholecystitis, diverticulitis, PID or ectopic pregnancy. Will re-check lab work to ensure no new findings. At this time do not believe further imaging is necessary, but will re-evaluate after labs.   Discussed case with Dr. Preston Fleeting who, after reviewing labs and vitals, agrees no additional imaging is needed at this time and that the pt is best served following up in an outpt setting. Resources were provided to the pt as well as a discussion that her lab work was reassuring today. Pt was sent home with some pain meds, some anti-emetics, and return precautions. Discussed reasons to seek immediate care. Patient expresses understanding and agrees with plan.   Glade Nurse, PA-C 03/04/13 0120

## 2013-03-03 NOTE — ED Notes (Signed)
Pt was here a week ago and was told that she had a cyst and is waiting on a follow up appointment, pt was sent home on meds but continues to have n/v and abd pain

## 2013-03-11 NOTE — ED Provider Notes (Signed)
Medical screening examination/treatment/procedure(s) were performed by non-physician practitioner and as supervising physician I was immediately available for consultation/collaboration.   Dione Booze, MD 03/11/13 1259

## 2013-03-12 ENCOUNTER — Encounter (HOSPITAL_COMMUNITY): Payer: Self-pay

## 2013-03-12 ENCOUNTER — Inpatient Hospital Stay (HOSPITAL_COMMUNITY)
Admission: AD | Admit: 2013-03-12 | Discharge: 2013-03-13 | Disposition: A | Discharge status: Home / Self Care | Payer: Medicaid Other | Source: Ambulatory Visit | Attending: Obstetrics & Gynecology | Admitting: Obstetrics & Gynecology

## 2013-03-12 DIAGNOSIS — N730 Acute parametritis and pelvic cellulitis: Secondary | ICD-10-CM

## 2013-03-12 DIAGNOSIS — R1031 Right lower quadrant pain: Secondary | ICD-10-CM | POA: Insufficient documentation

## 2013-03-12 DIAGNOSIS — A5901 Trichomonal vulvovaginitis: Secondary | ICD-10-CM | POA: Insufficient documentation

## 2013-03-12 HISTORY — DX: Schizophrenia, unspecified: F20.9

## 2013-03-12 HISTORY — DX: Shortness of breath: R06.02

## 2013-03-12 HISTORY — DX: Encounter for other specified aftercare: Z51.89

## 2013-03-12 HISTORY — DX: Bipolar disorder, unspecified: F31.9

## 2013-03-12 LAB — COMPREHENSIVE METABOLIC PANEL
AST: 11 U/L (ref 0–37)
Albumin: 3.9 g/dL (ref 3.5–5.2)
Alkaline Phosphatase: 57 U/L (ref 39–117)
BUN: 6 mg/dL (ref 6–23)
CO2: 29 mEq/L (ref 19–32)
Creatinine, Ser: 0.76 mg/dL (ref 0.50–1.10)
Glucose, Bld: 112 mg/dL — ABNORMAL HIGH (ref 70–99)
Total Bilirubin: 0.2 mg/dL — ABNORMAL LOW (ref 0.3–1.2)
Total Protein: 6.5 g/dL (ref 6.0–8.3)

## 2013-03-12 LAB — URINALYSIS, ROUTINE W REFLEX MICROSCOPIC
Bilirubin Urine: NEGATIVE
Ketones, ur: NEGATIVE mg/dL
Nitrite: NEGATIVE
Urobilinogen, UA: 0.2 mg/dL (ref 0.0–1.0)

## 2013-03-12 LAB — CBC
HCT: 39.1 % (ref 36.0–46.0)
Hemoglobin: 13.4 g/dL (ref 12.0–15.0)
MCH: 33.1 pg (ref 26.0–34.0)
MCV: 96.5 fL (ref 78.0–100.0)
RBC: 4.05 MIL/uL (ref 3.87–5.11)

## 2013-03-12 LAB — URINE MICROSCOPIC-ADD ON

## 2013-03-12 MED ORDER — KETOROLAC TROMETHAMINE 60 MG/2ML IM SOLN
60.0000 mg | Freq: Once | INTRAMUSCULAR | Status: AC
  Administered 2013-03-12: 60 mg via INTRAMUSCULAR
  Filled 2013-03-12: qty 2

## 2013-03-12 NOTE — MAU Provider Note (Signed)
History     CSN: [id number]  Arrival date and time: 03/12/13 2149   First Provider Initiated Contact with Patient 03/12/13 2311      Chief Complaint  Patient presents with  . Abdominal Pain  . Emesis   HPI Joyce Klein is a 27 y.o. [id number] who presents to MAU today with complaint of RLQ pain x 3 weeks. The patient has been evaluated for the same pain twice in the last 3 weeks at [facility]. She rates her pain at 8/10 now. She took 1/2 tab of oxycontin at noon with minimal relief. She has also had N/V without diarrhea or fever. She states that the pain radiated across her lower abdomen. She denies UTI symptoms or vaginal bleeding. She has a thin, white discharge without odor.    OB History   Grav Para Term Preterm Abortions TAB SAB Ect Mult Living   14 2 2  12  12   2       Past Medical History  Diagnosis Date  . Depression   . Headache(784.0)   . Shortness of breath   . Blood transfusion without reported diagnosis 2005    PPH  . Bipolar 1 disorder   . Schizophrenia     Past Surgical History  Procedure Laterality Date  . Tubal ligation    . Tubal ligation      Family History  Problem Relation Age of Onset  . Other Neg Hx     History  Substance Use Topics  . Smoking status: Current Every Day Smoker -- 0.50 packs/day for 14 years    Types: Cigarettes  . Smokeless tobacco: Never Used  . Alcohol Use: Yes    Allergies:  Allergies  Allergen Reactions  . Coconut Oil Anaphylaxis  . Other Anaphylaxis    Pecans  . Bee Venom     No prescriptions prior to admission    Review of Systems  Constitutional: Positive for malaise/fatigue. Negative for fever.  Gastrointestinal: Positive for nausea, vomiting and abdominal pain. Negative for diarrhea and constipation.  Genitourinary: Negative for dysuria, urgency and frequency.       Neg - vaginal bleeding + vaginal discharge  Neurological: Positive for weakness. Negative for dizziness and loss of consciousness.    Physical Exam   Blood pressure 107/71, pulse 68, resp. rate 16, height 5' 3.5" (1.613 m), weight 142 lb (64.411 kg), last menstrual period 03/06/2013, SpO2 100.00%.  Physical Exam  Constitutional: She is oriented to person, place, and time. She appears well-developed and well-nourished. No distress.  HENT:  Head: Normocephalic and atraumatic.  Cardiovascular: Normal rate, regular rhythm and normal heart sounds.   Respiratory: Effort normal and breath sounds normal. No respiratory distress.  GI: Soft. Bowel sounds are normal. She exhibits no distension and no mass. There is tenderness (moderate tenderness to palpation of the RLQ). There is no rebound and no guarding.  Genitourinary: Uterus is tender. Uterus is not enlarged. Cervix exhibits no motion tenderness, no discharge and no friability. Right adnexum displays tenderness. Right adnexum displays no mass. Left adnexum displays tenderness. Left adnexum displays no mass. No bleeding around the vagina. Vaginal discharge (small amount of thin, white, frothy discharge noted in the vagina) found.  Neurological: She is alert and oriented to person, place, and time.  Skin: Skin is warm and dry. No erythema.  Psychiatric: She has a normal mood and affect.   Results for orders placed during the hospital encounter of 03/12/13 (from the past 24 hour(s))  URINALYSIS, ROUTINE W REFLEX MICROSCOPIC     Status: Abnormal   Collection Time    03/12/13 11:03 PM      Result Value Range   Color, Urine YELLOW  YELLOW   APPearance CLEAR  CLEAR   Specific Gravity, Urine 1.010  1.005 - 1.030   pH 6.5  5.0 - 8.0   Glucose, UA NEGATIVE  NEGATIVE mg/dL   Hgb urine dipstick NEGATIVE  NEGATIVE   Bilirubin Urine NEGATIVE  NEGATIVE   Ketones, ur NEGATIVE  NEGATIVE mg/dL   Protein, ur NEGATIVE  NEGATIVE mg/dL   Urobilinogen, UA 0.2  0.0 - 1.0 mg/dL   Nitrite NEGATIVE  NEGATIVE   Leukocytes, UA TRACE (*) NEGATIVE  URINE MICROSCOPIC-ADD ON     Status: Abnormal    Collection Time    03/12/13 11:03 PM      Result Value Range   Squamous Epithelial / LPF MANY (*) RARE   WBC, UA 3-6  <3 WBC/hpf   Bacteria, UA FEW (*) RARE   Urine-Other MUCOUS PRESENT    CBC     Status: Abnormal   Collection Time    03/12/13 11:15 PM      Result Value Range   WBC 12.0 (*) 4.0 - 10.5 K/uL   RBC 4.05  3.87 - 5.11 MIL/uL   Hemoglobin 13.4  12.0 - 15.0 g/dL   HCT 46.9  62.9 - 52.8 %   MCV 96.5  78.0 - 100.0 fL   MCH 33.1  26.0 - 34.0 pg   MCHC 34.3  30.0 - 36.0 g/dL   RDW 41.3  24.4 - 01.0 %   Platelets 195  150 - 400 K/uL  COMPREHENSIVE METABOLIC PANEL     Status: Abnormal   Collection Time    03/12/13 11:15 PM      Result Value Range   Sodium 137  135 - 145 mEq/L   Potassium 4.1  3.5 - 5.1 mEq/L   Chloride 100  96 - 112 mEq/L   CO2 29  19 - 32 mEq/L   Glucose, Bld 112 (*) 70 - 99 mg/dL   BUN 6  6 - 23 mg/dL   Creatinine, Ser 2.72  0.50 - 1.10 mg/dL   Calcium 9.2  8.4 - 53.6 mg/dL   Total Protein 6.5  6.0 - 8.3 g/dL   Albumin 3.9  3.5 - 5.2 g/dL   AST 11  0 - 37 U/L   ALT 7  0 - 35 U/L   Alkaline Phosphatase 57  39 - 117 U/L   Total Bilirubin 0.2 (*) 0.3 - 1.2 mg/dL   GFR calc non Af Amer >90  >90 mL/min   GFR calc Af Amer >90  >90 mL/min  WET PREP, GENITAL     Status: Abnormal   Collection Time    03/13/13 12:07 AM      Result Value Range   Yeast Wet Prep HPF POC NONE SEEN  NONE SEEN   Trich, Wet Prep MODERATE (*) NONE SEEN   Clue Cells Wet Prep HPF POC FEW (*) NONE SEEN   WBC, Wet Prep HPF POC FEW (*) NONE SEEN    MAU Course  Procedures None  MDM UA, Wet prep, CBC, CMP today ODT Zofran given - patient able to tolerate PO prior to medication administration 2 G Flagyl, 1 G Zithromax and 250 mg Rocephin given in MAU  Assessment and Plan  A: Acute PID Trichomonas  P: Discharge home Patient treated in MAU  today Patient may continue with ibuprofen PRN for pain Patient advised of need for partners to be treated Patient encouraged to keep  appointment for follow-up in [facility] clinic as scheduled Patient may return to MAU as needed or if her condition were to change or worsen  Freddi Starr, PA-C  03/13/2013, 1:44 AM

## 2013-03-12 NOTE — MAU Note (Signed)
Pt reports vomiting and unable to eat for 1 month, also having pain. Seen at [facility] and diagnosed with ovarian cyst. Returned there again one week later with same symptoms, told to make appointment in GYN clinic. Has appoint on 08/14 but is still unable to keep anything down and pain is worsening.

## 2013-03-13 ENCOUNTER — Telehealth: Payer: Self-pay | Admitting: *Deleted

## 2013-03-13 LAB — GC/CHLAMYDIA PROBE AMP
CT Probe RNA: NEGATIVE
GC Probe RNA: NEGATIVE

## 2013-03-13 LAB — WET PREP, GENITAL: Yeast Wet Prep HPF POC: NONE SEEN

## 2013-03-13 MED ORDER — AZITHROMYCIN 250 MG PO TABS
1000.0000 mg | ORAL_TABLET | Freq: Once | ORAL | Status: AC
  Administered 2013-03-13: 1000 mg via ORAL
  Filled 2013-03-13: qty 4

## 2013-03-13 MED ORDER — METRONIDAZOLE 500 MG PO TABS
2000.0000 mg | ORAL_TABLET | Freq: Once | ORAL | Status: AC
  Administered 2013-03-13: 2000 mg via ORAL
  Filled 2013-03-13: qty 4

## 2013-03-13 MED ORDER — ONDANSETRON 8 MG PO TBDP
8.0000 mg | ORAL_TABLET | Freq: Once | ORAL | Status: AC
  Administered 2013-03-13: 8 mg via ORAL
  Filled 2013-03-13: qty 1

## 2013-03-13 MED ORDER — CEFTRIAXONE SODIUM 250 MG IJ SOLR
250.0000 mg | Freq: Once | INTRAMUSCULAR | Status: AC
  Administered 2013-03-13: 250 mg via INTRAMUSCULAR
  Filled 2013-03-13: qty 250

## 2013-03-13 NOTE — Telephone Encounter (Signed)
Pt left message stating that she has severe pain and wants sooner appt than 04/12/13.   Per chart review, pt went to MAU yesterday for evaluation and treatment.  No return call indicated at this time.

## 2013-03-14 LAB — URINE CULTURE

## 2013-04-12 ENCOUNTER — Ambulatory Visit (INDEPENDENT_AMBULATORY_CARE_PROVIDER_SITE_OTHER): Payer: Medicaid Other | Admitting: Nurse Practitioner

## 2013-04-12 ENCOUNTER — Encounter: Payer: Self-pay | Admitting: Medical

## 2013-04-12 VITALS — BP 103/68 | HR 100 | Temp 98.9°F | Ht 63.5 in | Wt 144.0 lb

## 2013-04-12 DIAGNOSIS — R102 Pelvic and perineal pain: Secondary | ICD-10-CM

## 2013-04-12 DIAGNOSIS — N949 Unspecified condition associated with female genital organs and menstrual cycle: Secondary | ICD-10-CM

## 2013-04-12 MED ORDER — TIZANIDINE HCL 4 MG PO TABS
4.0000 mg | ORAL_TABLET | Freq: Every day | ORAL | Status: DC
Start: 2013-04-12 — End: 2013-06-02

## 2013-04-12 NOTE — Progress Notes (Signed)
History:  Joyce Klein is a 27 y.o. [id number] who presents to clinic today for follow up on PID. She wants Trich testing repeated as she and boyfriend have reconciled and she does not believe he was treated. She was seen in MAU one month ago and had moderate trich, and few WBC's. She did have an elevated WBC at 12, GC and chlamydia were negative. She had CT of abdomen and pelvis in June 2014 that was negative. She has a history of bipolar untreated and drug dependence. She does not sleep well.  The following portions of the patient's history were reviewed and updated as appropriate: allergies, current medications, past family history, past medical history, past social history, past surgical history and problem list.  Review of Systems:  Pertinent items are noted in HPI.  Objective:  Physical Exam BP 103/68  Pulse 100  Temp(Src) 98.9 F (37.2 C)  Ht 5' 3.5" (1.613 m)  Wt 65.318 kg (144 lb)  BMI 25.11 kg/m2  LMP 03/30/2013 GENERAL: Well-developed, well-nourished female in no acute distress.  HEENT: Normocephalic, atraumatic.  NECK: Supple. Normal thyroid.  LUNGS: Normal rate. Clear to auscultation bilaterally.  HEART: Regular rate and rhythm with no adventitious sounds.  BREASTS: Symmetric in size. No masses, skin changes, nipple drainage, or lymphadenopathy. ABDOMEN: Soft, nontender, nondistended. No organomegaly. Normal bowel sounds appreciated in all quadrants.  PELVIC: Normal external female genitalia. Vagina is pink and rugated.  Normal discharge. Normal cervix contour. Pap smear obtained. Uterus is normal in size. No adnexal mass or tenderness.  EXTREMITIES: No cyanosis, clubbing, or edema, 2+ distal pulses.   Labs and Imaging No results found.  Assessment & Plan:  Assessment: Pelvic pain  ? PID  Plans: Discussed at length chronic pain. Pt has agreed to use Zanaflex at night to try to relax and sleep. We can increase the dose to 8 mg if needed. We discussed positions that  she can use sexually to increase her comforts. She can continue to use Excedrin and is discouraged from using narcotics. RTC one month for follow up.    Carolynn Serve, NP 04/12/2013 2:35 PM

## 2013-04-12 NOTE — Patient Instructions (Signed)
Pelvic Pain Pelvic pain is pain below the belly button and located between your hips. Acute pain may last a few hours or days. Chronic pelvic pain may last weeks and months. The cause may be different for different types of pain. The pain may be dull or sharp, mild or severe and can interfere with your daily activities. Write down and tell your caregiver:   Exactly where the pain is located.  If it comes and goes or is there all the time.  When it happens (with sex, urination, bowel movement, etc.)  If the pain is related to your menstrual period or stress. Your caregiver will take a full history and do a complete physical exam and Pap test. CAUSES   Painful menstrual periods (dysmenorrhea).  Normal ovulation (Mittelschmertz) that occurs in the middle of the menstrual cycle every month.  The pelvic organs get engorged with blood just before the menstrual period (pelvic congestive syndrome).  Scar tissue from an infection or past surgery (pelvic adhesions).  Cancer of the female pelvic organs. When there is pain with cancer, it has been there for a long time.  The lining of the uterus (endometrium) abnormally grows in places like the pelvis and on the pelvic organs (endometriosis).  A form of endometriosis with the lining of the uterus present inside of the muscle tissue of the uterus (adenomyosis).  Fibroid tumor (noncancerous) in the uterus.  Bladder problems such as infection, bladder spasms of the muscle tissue of the bladder.  Intestinal problems (irritable bowel syndrome, colitis, an ulcer or gastrointestinal infection).  Polyps of the cervix or uterus.  Pregnancy in the tube (ectopic pregnancy).  The opening of the cervix is too small for the menstrual blood to flow through it (cervical stenosis).  Physical or sexual abuse (past or present).  Musculo-skeletal problems from poor posture, problems with the vertebrae of the lower back or the uterine pelvic muscles falling  (prolapse).  Psychological problems such as depression or stress.  IUD (intrauterine device) in the uterus. DIAGNOSIS  Tests to make a diagnosis depends on the type, location, severity and what causes the pain to occur. Tests that may be needed include:  Blood tests.  Urine tests  Ultrasound.  X-rays.  CT Scan.  MRI.  Laparoscopy.  Major surgery. TREATMENT  Treatment will depend on the cause of the pain, which includes:  Prescription or over-the-counter pain medication.  Antibiotics.  Birth control pills.  Hormone treatment.  Nerve blocking injections.  Physical therapy.  Antidepressants.  Counseling with a psychiatrist or psychologist.  Minor or major surgery. HOME CARE INSTRUCTIONS   Only take over-the-counter or prescription medicines for pain, discomfort or fever as directed by your caregiver.  Follow your caregiver's advice to treat your pain.  Rest.  Avoid sexual intercourse if it causes the pain.  Apply warm or cold compresses (which ever works best) to the pain area.  Do relaxation exercises such as yoga or meditation.  Try acupuncture.  Avoid stressful situations.  Try group therapy.  If the pain is because of a stomach/intestinal upset, drink clear liquids, eat a bland light food diet until the symptoms go away. SEEK MEDICAL CARE IF:   You need stronger prescription pain medication.  You develop pain with sexual intercourse.  You have pain with urination.  You develop a temperature of 102 F (38.9 C) with the pain.  You are still in pain after 4 hours of taking prescription medication for the pain.  You need depression medication.    Your IUD is causing pain and you want it removed. SEEK IMMEDIATE MEDICAL CARE IF:  You develop very severe pain or tenderness.  You faint, have chills, severe weakness or dehydration.  You develop heavy vaginal bleeding or passing solid tissue.  You develop a temperature of 102 F (38.9 C)  with the pain.  You have blood in the urine.  You are being physically or sexually abused.  You have uncontrolled vomiting and diarrhea.  You are depressed and afraid of harming yourself or someone else. Document Released: 09/23/2004 Document Revised: 11/08/2011 Document Reviewed: 06/20/2008 Exit[employer] Patient Information 2013 [employer].  

## 2013-04-13 LAB — WET PREP, GENITAL: Yeast Wet Prep HPF POC: NONE SEEN

## 2013-04-25 ENCOUNTER — Encounter (HOSPITAL_COMMUNITY): Payer: Self-pay | Admitting: *Deleted

## 2013-04-25 ENCOUNTER — Emergency Department (HOSPITAL_COMMUNITY)
Admission: EM | Admit: 2013-04-25 | Discharge: 2013-04-25 | Disposition: A | Discharge status: Home / Self Care | Payer: Medicaid Other | Attending: Emergency Medicine | Admitting: Emergency Medicine

## 2013-04-25 DIAGNOSIS — Z79899 Other long term (current) drug therapy: Secondary | ICD-10-CM | POA: Insufficient documentation

## 2013-04-25 DIAGNOSIS — R51 Headache: Secondary | ICD-10-CM | POA: Insufficient documentation

## 2013-04-25 DIAGNOSIS — F17200 Nicotine dependence, unspecified, uncomplicated: Secondary | ICD-10-CM | POA: Insufficient documentation

## 2013-04-25 DIAGNOSIS — Z8659 Personal history of other mental and behavioral disorders: Secondary | ICD-10-CM | POA: Insufficient documentation

## 2013-04-25 DIAGNOSIS — S39012A Strain of muscle, fascia and tendon of lower back, initial encounter: Secondary | ICD-10-CM

## 2013-04-25 DIAGNOSIS — M545 Low back pain, unspecified: Secondary | ICD-10-CM | POA: Insufficient documentation

## 2013-04-25 MED ORDER — OXYCODONE-ACETAMINOPHEN 5-325 MG PO TABS
1.0000 | ORAL_TABLET | Freq: Once | ORAL | Status: AC
  Administered 2013-04-25: 1 via ORAL
  Filled 2013-04-25: qty 1

## 2013-04-25 MED ORDER — PREDNISONE 10 MG PO TABS: 20.0000 mg | ORAL_TABLET | Freq: Two times a day (BID) | ORAL | Status: DC

## 2013-04-25 MED ORDER — CYCLOBENZAPRINE HCL 10 MG PO TABS: 10.0000 mg | ORAL_TABLET | Freq: Two times a day (BID) | ORAL | Status: DC | PRN

## 2013-04-25 MED ORDER — CYCLOBENZAPRINE HCL 10 MG PO TABS
10.0000 mg | ORAL_TABLET | Freq: Once | ORAL | Status: AC
  Administered 2013-04-25: 10 mg via ORAL
  Filled 2013-04-25: qty 1

## 2013-04-25 NOTE — ED Provider Notes (Signed)
CSN: [id number]     Arrival date & time 04/25/13  1349 History   First MD Initiated Contact with Patient 04/25/13 1521     Chief Complaint  Patient presents with  . Back Pain   (Consider location/radiation/quality/duration/timing/severity/associated sxs/prior Treatment) Patient is a 27 y.o. female presenting with back pain. The history is provided by the patient.  Back Pain Location:  Lumbar spine Quality:  Aching and burning Radiates to:  Does not radiate Pain severity:  Severe Pain is:  Same all the time Onset quality:  Gradual Duration:  1 day Timing:  Constant Progression:  Worsening Chronicity:  New Context: physical stress   Relieved by:  Nothing Worsened by:  Movement, standing and bending Ineffective treatments:  None tried Associated symptoms: no chest pain, no dysuria, no fever, no headaches and no numbness    Joyce Klein is a 27 y.o. female who presents to the ED with low back pain after swimming and playing in the pool yesterday. She denies any other injuries.  Past Medical History  Diagnosis Date  . Depression   . Headache(784.0)   . Shortness of breath   . Blood transfusion without reported diagnosis 2005    PPH  . Bipolar 1 disorder   . Schizophrenia    Past Surgical History  Procedure Laterality Date  . Tubal ligation    . Tubal ligation     Family History  Problem Relation Age of Onset  . Other Neg Hx   . Irritable bowel syndrome Mother    History  Substance Use Topics  . Smoking status: Current Every Day Smoker -- 0.50 packs/day for 14 years    Types: Cigarettes  . Smokeless tobacco: Never Used  . Alcohol Use: Yes   OB History   Grav Para Term Preterm Abortions TAB SAB Ect Mult Living   14 2 2  12  12   2      Review of Systems  Constitutional: Negative for fever and chills.  HENT: Negative for neck pain.   Eyes: Negative for visual disturbance.  Respiratory: Negative for shortness of breath.   Cardiovascular: Negative for chest  pain.  Gastrointestinal: Negative for nausea and vomiting.  Genitourinary: Negative for dysuria and urgency.  Musculoskeletal: Positive for back pain.  Skin: Negative for wound.  Neurological: Negative for numbness and headaches.  Psychiatric/Behavioral: The patient is not nervous/anxious.     Allergies  Coconut oil; Other; and Bee venom  Home Medications   Current Outpatient Rx  Name  Route  Sig  Dispense  Refill  . albuterol (PROVENTIL HFA;VENTOLIN HFA) 108 (90 BASE) MCG/ACT inhaler   Inhalation   Inhale 2 puffs into the lungs every 6 (six) hours as needed. For shortness of breath         . tiZANidine (ZANAFLEX) 4 MG tablet   Oral   Take 1 tablet (4 mg total) by mouth at bedtime.   30 tablet   1    BP 114/70  Pulse 90  Temp(Src) 97.3 F (36.3 C) (Oral)  Resp 20  Ht 5\' 3"  (1.6 m)  Wt 143 lb (64.864 kg)  BMI 25.34 kg/m2  SpO2 100%  LMP 04/25/2013 Physical Exam  Nursing note and vitals reviewed. Constitutional: She is oriented to person, place, and time. She appears well-developed and well-nourished. No distress.  HENT:  Head: Normocephalic and atraumatic.  Eyes: EOM are normal.  Neck: Neck supple.  Cardiovascular: Normal rate, regular rhythm and normal heart sounds.   Pulmonary/Chest: Effort  normal and breath sounds normal.  Abdominal: Soft. There is no tenderness.  Musculoskeletal:       Back:  Tender with range of motion and palpation bilateral lower back. Pedal pulses equal bilateral. Adequate circulation, good touch sensation. Straight leg raises without difficulty but patient complains of pain. Stead gait without foot drag.   Neurological: She is alert and oriented to person, place, and time. She has normal strength and normal reflexes. No cranial nerve deficit or sensory deficit. Gait normal.  Skin: Skin is warm and dry.  Psychiatric: She has a normal mood and affect. Her behavior is normal.    ED Course  Procedures  MDM  27 y.o. female with lumbar  strain. Will treat with muscle relaxants and steroids.  Discussed with the patient and all questioned fully answered   Medication List    TAKE these medications       cyclobenzaprine 10 MG tablet  Commonly known as:  FLEXERIL  Take 1 tablet (10 mg total) by mouth 2 (two) times daily as needed for muscle spasms.     predniSONE 10 MG tablet  Commonly known as:  DELTASONE  Take 2 tablets (20 mg total) by mouth 2 (two) times daily.      ASK your doctor about these medications       albuterol 108 (90 BASE) MCG/ACT inhaler  Commonly known as:  PROVENTIL HFA;VENTOLIN HFA  Inhale 2 puffs into the lungs every 6 (six) hours as needed. For shortness of breath     tiZANidine 4 MG tablet  Commonly known as:  ZANAFLEX  Take 1 tablet (4 mg total) by mouth at bedtime.         [facility] Orlene Och, [place] 04/25/13 1539

## 2013-04-25 NOTE — ED Notes (Signed)
Reports swimming and horse playing yesterday and starting having mid to lower back pain.

## 2013-04-25 NOTE — ED Notes (Signed)
Back pain since yesterday when at pool, "playing around"

## 2013-04-27 NOTE — ED Provider Notes (Signed)
Medical screening examination/treatment/procedure(s) were performed by non-physician practitioner and as supervising physician I was immediately available for consultation/collaboration.  Donnetta Hutching, MD 04/27/13 [phone]

## 2013-05-03 ENCOUNTER — Telehealth: Payer: Self-pay | Admitting: *Deleted

## 2013-05-03 NOTE — Telephone Encounter (Addendum)
Pt left message that she is having cramps again, also some vomiting. Please call back.  9/5  1255  Called pt and discussed her concerns.  She reports her LMP was 04/27/13 and she noticed 2 rather large clots during the period. She also has been having abdominal/pelvic cramps which are made worse by eating. I asked pt if she has been taking the flexeril or any ibuprofen for her cramps.  Pt stated that she has tried flexeril and excedrin but since she can't hold down any food, she is unable to retain them long enough to do any good. I advised pt that she needs to be seen at an Urgent care facility or PCP for her problem of nausea and vomiting. Once that is under control, she should try flexeril and/or ibuprofen as previously directed if she is still having cramps.  Pt also reports dyspareunia which is a new problem since her office visit on 04/12/13.  I told her that she may call back on Monday for appt if she desires- all scheduling staff are gone for the day now.  Pt voiced understanding of all information and instructions given.

## 2013-05-04 ENCOUNTER — Encounter (HOSPITAL_COMMUNITY): Payer: Self-pay | Admitting: *Deleted

## 2013-05-04 ENCOUNTER — Emergency Department (HOSPITAL_COMMUNITY)
Admission: EM | Admit: 2013-05-04 | Discharge: 2013-05-04 | Disposition: A | Discharge status: Home / Self Care | Payer: Medicaid Other | Attending: Emergency Medicine | Admitting: Emergency Medicine

## 2013-05-04 DIAGNOSIS — Z3202 Encounter for pregnancy test, result negative: Secondary | ICD-10-CM | POA: Insufficient documentation

## 2013-05-04 DIAGNOSIS — R1110 Vomiting, unspecified: Secondary | ICD-10-CM | POA: Insufficient documentation

## 2013-05-04 DIAGNOSIS — R197 Diarrhea, unspecified: Secondary | ICD-10-CM | POA: Insufficient documentation

## 2013-05-04 DIAGNOSIS — R102 Pelvic and perineal pain: Secondary | ICD-10-CM

## 2013-05-04 DIAGNOSIS — N949 Unspecified condition associated with female genital organs and menstrual cycle: Secondary | ICD-10-CM | POA: Insufficient documentation

## 2013-05-04 DIAGNOSIS — IMO0002 Reserved for concepts with insufficient information to code with codable children: Secondary | ICD-10-CM | POA: Insufficient documentation

## 2013-05-04 DIAGNOSIS — N898 Other specified noninflammatory disorders of vagina: Secondary | ICD-10-CM | POA: Insufficient documentation

## 2013-05-04 DIAGNOSIS — Z8659 Personal history of other mental and behavioral disorders: Secondary | ICD-10-CM | POA: Insufficient documentation

## 2013-05-04 DIAGNOSIS — F17200 Nicotine dependence, unspecified, uncomplicated: Secondary | ICD-10-CM | POA: Insufficient documentation

## 2013-05-04 LAB — CBC WITH DIFFERENTIAL/PLATELET
Eosinophils Absolute: 0.3 10*3/uL (ref 0.0–0.7)
Eosinophils Relative: 4 % (ref 0–5)
HCT: 43.1 % (ref 36.0–46.0)
Lymphocytes Relative: 29 % (ref 12–46)
Lymphs Abs: 2 10*3/uL (ref 0.7–4.0)
MCH: 33.6 pg (ref 26.0–34.0)
MCV: 98.6 fL (ref 78.0–100.0)
Monocytes Absolute: 0.5 10*3/uL (ref 0.1–1.0)
Monocytes Relative: 7 % (ref 3–12)
Platelets: 199 10*3/uL (ref 150–400)
RBC: 4.37 MIL/uL (ref 3.87–5.11)
WBC: 7 10*3/uL (ref 4.0–10.5)

## 2013-05-04 LAB — URINALYSIS, ROUTINE W REFLEX MICROSCOPIC
Bilirubin Urine: NEGATIVE
Glucose, UA: NEGATIVE mg/dL
Hgb urine dipstick: NEGATIVE
Ketones, ur: NEGATIVE mg/dL
Protein, ur: NEGATIVE mg/dL
Urobilinogen, UA: 0.2 mg/dL (ref 0.0–1.0)

## 2013-05-04 LAB — BASIC METABOLIC PANEL
BUN: 9 mg/dL (ref 6–23)
CO2: 28 mEq/L (ref 19–32)
Calcium: 9.4 mg/dL (ref 8.4–10.5)
Creatinine, Ser: 0.73 mg/dL (ref 0.50–1.10)
GFR calc non Af Amer: >90 mL/min (ref >90)
Glucose, Bld: 113 mg/dL — ABNORMAL HIGH (ref 70–99)

## 2013-05-04 LAB — PREGNANCY, URINE: Preg Test, Ur: NEGATIVE

## 2013-05-04 MED ORDER — SODIUM CHLORIDE 0.9 % IV BOLUS (SEPSIS)
1000.0000 mL | Freq: Once | INTRAVENOUS | Status: AC
  Administered 2013-05-04: 1000 mL via INTRAVENOUS

## 2013-05-04 MED ORDER — HYDROCODONE-ACETAMINOPHEN 5-325 MG PO TABS: 1.0000 | ORAL_TABLET | Freq: Four times a day (QID) | ORAL | Status: DC | PRN

## 2013-05-04 MED ORDER — HYDROMORPHONE HCL PF 1 MG/ML IJ SOLN: 1.0000 mg | Freq: Once | INTRAMUSCULAR | Status: AC

## 2013-05-04 MED ORDER — HYDROMORPHONE HCL PF 1 MG/ML IJ SOLN
INTRAMUSCULAR | Status: AC
  Administered 2013-05-04: 1 mg via INTRAVENOUS
  Filled 2013-05-04: qty 1

## 2013-05-04 MED ORDER — ONDANSETRON HCL 4 MG/2ML IJ SOLN
4.0000 mg | Freq: Once | INTRAMUSCULAR | Status: AC
  Administered 2013-05-04: 4 mg via INTRAVENOUS
  Filled 2013-05-04: qty 2

## 2013-05-04 MED ORDER — HYDROMORPHONE HCL PF 1 MG/ML IJ SOLN
1.0000 mg | Freq: Once | INTRAMUSCULAR | Status: AC
  Administered 2013-05-04: 1 mg via INTRAVENOUS
  Filled 2013-05-04: qty 1

## 2013-05-04 NOTE — ED Notes (Signed)
Dr. Estell Harpin notified of pt request for pain medication.

## 2013-05-04 NOTE — ED Provider Notes (Signed)
CSN: [id number]     Arrival date & time 05/04/13  1424 History  This chart was scribed for Benny Lennert, MD by Quintella Reichert, ED scribe.  This patient was seen in room APA15/APA15 and the patient's care was started at 3:23 PM.    Chief Complaint  Patient presents with  . Abdominal Pain    Patient is a 27 y.o. female presenting with abdominal pain. The history is provided by the patient. No language interpreter was used.  Abdominal Pain Pain location:  Suprapubic Pain quality: cramping   Pain radiates to:  Does not radiate Pain severity:  Severe Duration:  3 days Associated symptoms: cough (chronic related to smoking, at baseline), diarrhea, vaginal bleeding and vomiting   Associated symptoms: no chest pain, no chills, no fatigue, no fever and no hematuria     HPI Comments:  BRANDACE CARGLE is a 27 y.o. female who presents to the Emergency Department complaining of 2-3 days of severe cramping suprapubic abdominal pain with associated abnormal vaginal bleeding, vomiting and one episode of diarrhea.  Pt states that she has been passing large blood clots.  LNMP ended last week.  She denies fever, chills, or any other new associated symptoms.  Pt medicates with Flexeril and Zanaflex for muscle spasms.  She has h/o tubal ligation.   Past Medical History  Diagnosis Date  . Depression   . Headache(784.0)   . Shortness of breath   . Blood transfusion without reported diagnosis 2005    PPH  . Bipolar 1 disorder   . Schizophrenia    Past Surgical History  Procedure Laterality Date  . Tubal ligation    . Tubal ligation     Family History  Problem Relation Age of Onset  . Other Neg Hx   . Irritable bowel syndrome Mother    History  Substance Use Topics  . Smoking status: Current Every Day Smoker -- 0.50 packs/day for 14 years    Types: Cigarettes  . Smokeless tobacco: Never Used  . Alcohol Use: Yes   OB History   Grav Para Term Preterm Abortions TAB SAB Ect Mult Living   14 2 2  12  12   2      Review of Systems  Constitutional: Negative for fever, chills, appetite change and fatigue.  HENT: Negative for congestion, sinus pressure and ear discharge.   Eyes: Negative for discharge.  Respiratory: Positive for cough (chronic related to smoking, at baseline).   Cardiovascular: Negative for chest pain.  Gastrointestinal: Positive for vomiting and diarrhea. Negative for abdominal pain.  Genitourinary: Positive for vaginal bleeding. Negative for frequency and hematuria.  Musculoskeletal: Negative for back pain.  Skin: Negative for rash.  Neurological: Negative for seizures and headaches.  Psychiatric/Behavioral: Negative for hallucinations.    Allergies  Coconut oil; Other; and Bee venom  Home Medications   Current Outpatient Rx  Name  Route  Sig  Dispense  Refill  . albuterol (PROVENTIL HFA;VENTOLIN HFA) 108 (90 BASE) MCG/ACT inhaler   Inhalation   Inhale 2 puffs into the lungs every 6 (six) hours as needed. For shortness of breath         . cyclobenzaprine (FLEXERIL) 10 MG tablet   Oral   Take 1 tablet (10 mg total) by mouth 2 (two) times daily as needed for muscle spasms.   20 tablet   0   . predniSONE (DELTASONE) 10 MG tablet   Oral   Take 2 tablets (20 mg total) by mouth  2 (two) times daily.   20 tablet   0   . tiZANidine (ZANAFLEX) 4 MG tablet   Oral   Take 1 tablet (4 mg total) by mouth at bedtime.   30 tablet   1    BP 119/79  Pulse 116  Temp(Src) 98.2 F (36.8 C) (Oral)  Resp 20  Ht 5\' 3"  (1.6 m)  Wt 144 lb (65.318 kg)  BMI 25.51 kg/m2  SpO2 98%  LMP 04/25/2013  Physical Exam  Nursing note and vitals reviewed. Constitutional: She is oriented to person, place, and time. She appears well-developed.  HENT:  Head: Normocephalic.  Eyes: Conjunctivae and EOM are normal. No scleral icterus.  Neck: Neck supple. No thyromegaly present.  Cardiovascular: Normal rate and regular rhythm.  Exam reveals no gallop and no friction  rub.   No murmur heard. Pulmonary/Chest: No stridor. She has no wheezes. She has no rales. She exhibits no tenderness.  Abdominal: Soft. Bowel sounds are normal. She exhibits no distension. There is tenderness. There is no rebound.  Moderately tender to suprapubic area  Genitourinary:  Mild tender cervix and adenexal  Musculoskeletal: Normal range of motion. She exhibits no edema.  Lymphadenopathy:    She has no cervical adenopathy.  Neurological: She is oriented to person, place, and time. Coordination normal.  Skin: No rash noted. No erythema.  Psychiatric: She has a normal mood and affect. Her behavior is normal.    ED Course  Procedures (including critical care time)  DIAGNOSTIC STUDIES: Oxygen Saturation is 98% on room air, normal by my interpretation.    COORDINATION OF CARE: 3:26 PM: Discussed treatment plan which includes pain medication, anti-emetics, and labs.  Pt expressed understanding and agreed to plan.   Labs Review Labs Reviewed  CBC WITH DIFFERENTIAL  BASIC METABOLIC PANEL   Imaging Review No results found.  MDM  No diagnosis found. Pelvic pain   The chart was scribed for me under my direct supervision.  I personally performed the history, physical, and medical decision making and all procedures in the evaluation of this patient.Benny Lennert, MD 05/04/13 [phone]

## 2013-05-04 NOTE — ED Notes (Signed)
Patient with no complaints at this time. Respirations even and unlabored. Skin warm/dry. Discharge instructions reviewed with patient at this time. Patient given opportunity to voice concerns/ask questions. IV removed per policy and band-aid applied to site. Patient discharged at this time and left Emergency Department with steady gait.  

## 2013-05-04 NOTE — ED Notes (Signed)
Requesting more medication for pain - will notify EDP.

## 2013-05-04 NOTE — ED Notes (Signed)
C/o n/v x 3 days; reports pain with sexual intercourse; reports spotting after sexual intercourse last night; states last passed a blood clot 2 days ago.  C/o lower abd pain/pelvic pain, worse with movement/walking.

## 2013-05-04 NOTE — ED Notes (Addendum)
abd pain, vag bleeding with clots.  Vomiting , diarrhea

## 2013-05-23 ENCOUNTER — Encounter (HOSPITAL_COMMUNITY): Payer: Self-pay | Admitting: Emergency Medicine

## 2013-05-23 ENCOUNTER — Emergency Department (HOSPITAL_COMMUNITY)
Admission: EM | Admit: 2013-05-23 | Discharge: 2013-05-23 | Disposition: A | Discharge status: Home / Self Care | Payer: Medicaid Other | Attending: Emergency Medicine | Admitting: Emergency Medicine

## 2013-05-23 ENCOUNTER — Emergency Department (HOSPITAL_COMMUNITY): Payer: Medicaid Other

## 2013-05-23 DIAGNOSIS — H539 Unspecified visual disturbance: Secondary | ICD-10-CM | POA: Insufficient documentation

## 2013-05-23 DIAGNOSIS — S0990XA Unspecified injury of head, initial encounter: Secondary | ICD-10-CM | POA: Insufficient documentation

## 2013-05-23 DIAGNOSIS — R42 Dizziness and giddiness: Secondary | ICD-10-CM | POA: Insufficient documentation

## 2013-05-23 DIAGNOSIS — W19XXXA Unspecified fall, initial encounter: Secondary | ICD-10-CM

## 2013-05-23 DIAGNOSIS — Z79899 Other long term (current) drug therapy: Secondary | ICD-10-CM | POA: Insufficient documentation

## 2013-05-23 DIAGNOSIS — Z3202 Encounter for pregnancy test, result negative: Secondary | ICD-10-CM | POA: Insufficient documentation

## 2013-05-23 DIAGNOSIS — Y939 Activity, unspecified: Secondary | ICD-10-CM | POA: Insufficient documentation

## 2013-05-23 DIAGNOSIS — Y9289 Other specified places as the place of occurrence of the external cause: Secondary | ICD-10-CM | POA: Insufficient documentation

## 2013-05-23 DIAGNOSIS — S79919A Unspecified injury of unspecified hip, initial encounter: Secondary | ICD-10-CM | POA: Insufficient documentation

## 2013-05-23 DIAGNOSIS — S139XXA Sprain of joints and ligaments of unspecified parts of neck, initial encounter: Secondary | ICD-10-CM | POA: Insufficient documentation

## 2013-05-23 DIAGNOSIS — Z8659 Personal history of other mental and behavioral disorders: Secondary | ICD-10-CM | POA: Insufficient documentation

## 2013-05-23 DIAGNOSIS — S161XXA Strain of muscle, fascia and tendon at neck level, initial encounter: Secondary | ICD-10-CM

## 2013-05-23 DIAGNOSIS — F17200 Nicotine dependence, unspecified, uncomplicated: Secondary | ICD-10-CM | POA: Insufficient documentation

## 2013-05-23 DIAGNOSIS — W108XXA Fall (on) (from) other stairs and steps, initial encounter: Secondary | ICD-10-CM | POA: Insufficient documentation

## 2013-05-23 DIAGNOSIS — S335XXA Sprain of ligaments of lumbar spine, initial encounter: Secondary | ICD-10-CM | POA: Insufficient documentation

## 2013-05-23 DIAGNOSIS — S39012A Strain of muscle, fascia and tendon of lower back, initial encounter: Secondary | ICD-10-CM

## 2013-05-23 LAB — URINE MICROSCOPIC-ADD ON

## 2013-05-23 LAB — CBC WITH DIFFERENTIAL/PLATELET
Eosinophils Relative: 5 % (ref 0–5)
HCT: 44.2 % (ref 36.0–46.0)
Hemoglobin: 15.1 g/dL — ABNORMAL HIGH (ref 12.0–15.0)
Lymphocytes Relative: 36 % (ref 12–46)
Lymphs Abs: 2.2 10*3/uL (ref 0.7–4.0)
MCH: 33.8 pg (ref 26.0–34.0)
MCHC: 34.2 g/dL (ref 30.0–36.0)
Monocytes Absolute: 0.4 10*3/uL (ref 0.1–1.0)
Monocytes Relative: 7 % (ref 3–12)
Neutrophils Relative %: 52 % (ref 43–77)
Platelets: 223 10*3/uL (ref 150–400)
RBC: 4.47 MIL/uL (ref 3.87–5.11)
WBC: 6.1 10*3/uL (ref 4.0–10.5)

## 2013-05-23 LAB — BASIC METABOLIC PANEL
CO2: 27 mEq/L (ref 19–32)
Chloride: 103 mEq/L (ref 96–112)
Creatinine, Ser: 0.76 mg/dL (ref 0.50–1.10)
GFR calc Af Amer: >90 mL/min (ref >90)
Glucose, Bld: 111 mg/dL — ABNORMAL HIGH (ref 70–99)
Potassium: 3.7 mEq/L (ref 3.5–5.1)
Sodium: 138 mEq/L (ref 135–145)

## 2013-05-23 LAB — URINALYSIS, ROUTINE W REFLEX MICROSCOPIC
Glucose, UA: NEGATIVE mg/dL
Ketones, ur: NEGATIVE mg/dL
Specific Gravity, Urine: 1.015 (ref 1.005–1.030)
Urobilinogen, UA: 0.2 mg/dL (ref 0.0–1.0)
pH: 7.5 (ref 5.0–8.0)

## 2013-05-23 LAB — POCT PREGNANCY, URINE: Preg Test, Ur: NEGATIVE

## 2013-05-23 MED ORDER — HYDROCODONE-ACETAMINOPHEN 5-325 MG PO TABS
1.0000 | ORAL_TABLET | Freq: Once | ORAL | Status: AC
  Administered 2013-05-23: 1 via ORAL
  Filled 2013-05-23: qty 1

## 2013-05-23 MED ORDER — HYDROCODONE-ACETAMINOPHEN 5-325 MG PO TABS: 1.0000 | ORAL_TABLET | ORAL | Status: DC | PRN

## 2013-05-23 MED ORDER — KETOROLAC TROMETHAMINE 60 MG/2ML IM SOLN: 60.0000 mg | Freq: Once | INTRAMUSCULAR | Status: DC

## 2013-05-23 MED ORDER — IBUPROFEN 800 MG PO TABS
800.0000 mg | ORAL_TABLET | Freq: Once | ORAL | Status: AC
  Administered 2013-05-23: 800 mg via ORAL
  Filled 2013-05-23: qty 1

## 2013-05-23 MED ORDER — IBUPROFEN 600 MG PO TABS: 600.0000 mg | ORAL_TABLET | Freq: Four times a day (QID) | ORAL | Status: DC | PRN

## 2013-05-23 NOTE — ED Notes (Signed)
Patient reports falling down 15 steps last night. Patient c/o pain that radiates from neck to lower back. CNS intact. Patient reported some tingling in lower back that has subsided.

## 2013-05-23 NOTE — ED Notes (Signed)
MD at bedside. 

## 2013-05-23 NOTE — ED Notes (Signed)
Per Brooke,NT. POC pregnancy is negative.

## 2013-05-23 NOTE — ED Provider Notes (Signed)
CSN: [id number]     Arrival date & time 05/23/13  0848 History   First MD Initiated Contact with Patient 05/23/13 [phone]     Chief Complaint  Patient presents with  . Neck Pain  . Back Pain   (Consider location/radiation/quality/duration/timing/severity/associated sxs/prior Treatment) HPI Comments: Joyce Klein is a 27 y.o. Female presenting with pain in her neck, head, right hip and lower back after falling down a full flight of steps last night. She was at a local motel when she slipped going down 15 steps after feeling lightheaded. She describes having intermittent episodes of lightheadedness generally occuring with migraine headache episodes, but has become more frequent and not always associated with development of headache.  She notes having black floaters in her field of vision with these episodes.  She denies fainting or having loc with her fall.  Her feet came out from under her,  Landing on her buttocks then somehow turning onto her right side by the end of the fall.  Pain is worse with movement and palpation.  She denies nausea, vomiting,  Focal weakness or numbness.  Denies fevers or photophobia. Additionally she denies palpitations, chest pain, abdominal pain and shortness of breath.  She has taken no medicines prior to arrival.     The history is provided by the patient.    Past Medical History  Diagnosis Date  . Depression   . Headache(784.0)   . Shortness of breath   . Blood transfusion without reported diagnosis 2005    PPH  . Bipolar 1 disorder   . Schizophrenia    Past Surgical History  Procedure Laterality Date  . Tubal ligation    . Tubal ligation     Family History  Problem Relation Age of Onset  . Other Neg Hx   . Irritable bowel syndrome Mother    History  Substance Use Topics  . Smoking status: Current Every Day Smoker -- 0.25 packs/day for 14 years    Types: Cigarettes  . Smokeless tobacco: Never Used  . Alcohol Use: Yes     Comment: occasioanal    OB History   Grav Para Term Preterm Abortions TAB SAB Ect Mult Living   14 2 2  12  12   2      Review of Systems  Constitutional: Negative for fever and chills.  HENT: Positive for neck pain. Negative for ear pain, rhinorrhea and neck stiffness.   Eyes: Positive for visual disturbance. Negative for photophobia.  Respiratory: Negative for shortness of breath.   Cardiovascular: Negative for palpitations and leg swelling.  Musculoskeletal: Positive for back pain and arthralgias. Negative for myalgias and joint swelling.  Skin: Negative for color change.  Neurological: Positive for light-headedness and headaches. Negative for weakness and numbness.    Allergies  Coconut oil; Other; and Bee venom  Home Medications   Current Outpatient Rx  Name  Route  Sig  Dispense  Refill  . albuterol (PROVENTIL HFA;VENTOLIN HFA) 108 (90 BASE) MCG/ACT inhaler   Inhalation   Inhale 2 puffs into the lungs every 6 (six) hours as needed. For shortness of breath         . cyclobenzaprine (FLEXERIL) 10 MG tablet   Oral   Take 1 tablet (10 mg total) by mouth 2 (two) times daily as needed for muscle spasms.   20 tablet   0   . tiZANidine (ZANAFLEX) 4 MG tablet   Oral   Take 1 tablet (4 mg total) by mouth at  bedtime.   30 tablet   1   . HYDROcodone-acetaminophen (NORCO/VICODIN) 5-325 MG per tablet   Oral   Take 1 tablet by mouth every 4 (four) hours as needed for pain.   15 tablet   0   . ibuprofen (ADVIL,MOTRIN) 600 MG tablet   Oral   Take 1 tablet (600 mg total) by mouth every 6 (six) hours as needed for pain.   20 tablet   0    BP 114/79  Pulse 112  Temp(Src) 97.5 F (36.4 C) (Oral)  Resp 18  Ht 5' 3.5" (1.613 m)  Wt 143 lb (64.864 kg)  BMI 24.93 kg/m2  SpO2 100%  LMP 05/19/2013 Physical Exam  Nursing note and vitals reviewed. Constitutional: She is oriented to person, place, and time. She appears well-developed and well-nourished.  Uncomfortable appearing  HENT:  Head:  Normocephalic and atraumatic.  Mouth/Throat: Oropharynx is clear and moist.  Eyes: EOM are normal. Pupils are equal, round, and reactive to light.  Neck: Neck supple. Spinous process tenderness and muscular tenderness present.  Pt in phili collar.  Cardiovascular: Normal rate and normal heart sounds.   Pulmonary/Chest: Effort normal.  Abdominal: Soft. There is no tenderness.  Musculoskeletal: Normal range of motion. She exhibits tenderness.       Right hip: She exhibits bony tenderness. She exhibits no swelling, no crepitus and no deformity.       Lumbar back: She exhibits bony tenderness. She exhibits no swelling, no edema and no deformity.  Bony ttp over right greater trochanter.  Lymphadenopathy:    She has no cervical adenopathy.  Neurological: She is alert and oriented to person, place, and time. She has normal strength. No sensory deficit. Gait normal. GCS eye subscore is 4. GCS verbal subscore is 5. GCS motor subscore is 6.  Normal heel-shin, normal rapid alternating movements. Cranial nerves III-XII intact.  No pronator drift.  Skin: Skin is warm and dry. No rash noted.  Psychiatric: She has a normal mood and affect. Her speech is normal and behavior is normal. Thought content normal. Cognition and memory are normal.    ED Course  Procedures (including critical care time)    Date: 05/23/2013  Rate: 64  Rhythm: normal sinus rhythm  QRS Axis: normal  Intervals: normal  ST/T Wave abnormalities: normal  Conduction Disutrbances:none  Narrative Interpretation:   Old EKG Reviewed: unchanged   Labs Review Labs Reviewed  CBC WITH DIFFERENTIAL - Abnormal; Notable for the following:    Hemoglobin 15.1 (*)    All other components within normal limits  BASIC METABOLIC PANEL - Abnormal; Notable for the following:    Glucose, Bld 111 (*)    All other components within normal limits  URINALYSIS, ROUTINE W REFLEX MICROSCOPIC - Abnormal; Notable for the following:    Hgb urine  dipstick TRACE (*)    All other components within normal limits  URINE MICROSCOPIC-ADD ON - Abnormal; Notable for the following:    Squamous Epithelial / LPF FEW (*)    All other components within normal limits  POCT PREGNANCY, URINE   Imaging Review Dg Cervical Spine Complete  05/23/2013   CLINICAL DATA:  27 year old female status post fall with pain.  EXAM: CERVICAL SPINE  4+ VIEWS  COMPARISON:  Cervical spine CT 08/26/2010.  FINDINGS: Similar straightening and mild reversal of cervical lordosis. Prevertebral soft tissue contour within normal limits. Cervicothoracic junction alignment is within normal limits. Relatively preserved disc spaces. Bilateral posterior element alignment is within normal limits.  AP alignment and lung apices within normal limits. C1-C2 alignment and odontoid within normal limits.  IMPRESSION: No acute fracture or listhesis identified in the cervical spine. Ligamentous injury is not excluded.   Electronically Signed   By: Augusto Gamble M.D.   On: 05/23/2013 11:37   Dg Lumbar Spine Complete  05/23/2013   CLINICAL DATA:  Larey Seat down stairs last night, back and right hip pain  EXAM: LUMBAR SPINE - COMPLETE 4+ VIEW  COMPARISON:  05/01/2011  FINDINGS: Five non-rib bearing lumbar vertebrae.  Osseous mineralization normal.  Vertebral body and disk space heights maintained.  No acute fracture, subluxation or bone destruction.  No spondylolysis.  SI joints symmetric.  IMPRESSION: Normal exam.   Electronically Signed   By: Ulyses Southward M.D.   On: 05/23/2013 11:29   Dg Hip Complete Right  05/23/2013   CLINICAL DATA:  Back and right hip pain, fell down stairs last night  EXAM: RIGHT HIP - COMPLETE 2+ VIEW  COMPARISON:  None  FINDINGS: Symmetric hip and SI joints.  Osseous mineralization normal.  No acute fracture, dislocation, or bone destruction.  Visualized portion of lower lumbar spine unremarkable.  Pelvic phleboliths noted.  IMPRESSION: Normal exam.   Electronically Signed   By: Ulyses Southward M.D.   On: 05/23/2013 11:30   Ct Head Wo Contrast  05/23/2013   CLINICAL DATA:  Larey Seat down stairs last night, headache  EXAM: CT HEAD WITHOUT CONTRAST  TECHNIQUE: Contiguous axial images were obtained from the base of the skull through the vertex without intravenous contrast.  COMPARISON:  CT brain scan of 08/26/2010  FINDINGS: The ventricular system is normal in size and configuration in the septum is midline in position. The 4th ventricle and basilar cisterns are unremarkable. No hemorrhage, mass lesion, or acute infarction is seen. On bone window images, no calvarial abnormality is seen. The paranasal sinuses appear pneumatized.  IMPRESSION: Negative unenhanced CT of the brain.   Electronically Signed   By: Dwyane Dee M.D.   On: 05/23/2013 11:57    MDM   1. Cervical strain, acute, initial encounter   2. Lumbar strain, initial encounter   3. Fall, initial encounter    Pt with fall and back injury after having acute on chronic intermittent episodes of lightheadedness.  Pt has been treated for anemia, none today per labs.  xrays and head CT reviewed,  ekg normal.  No lightheadedness while in ed.   Pt given pain medicine prescription,  Encouraged establishing pcp,  Referrals given.  The patient appears reasonably screened and/or stabilized for discharge and I doubt any other medical condition or other [facility] requiring further screening, evaluation, or treatment in the ED at this time prior to discharge.     Burgess Amor, PA-C 05/23/13 1705

## 2013-05-23 NOTE — Care Management ED Note (Signed)
       CARE MANAGEMENT ED NOTE 05/23/2013  Patient:  Joyce Klein, Joyce Klein   Account Number:  [id number]  Date Initiated:  05/23/2013  Documentation initiated by:  Anibal Henderson  Subjective/Objective Assessment:     Subjective/Objective Assessment Detail:   ED/CM noted patient did not have health insurance and/or PCP listed in the computer.  Patient was given the [facility] with information on the clinics, food pantries, and the handout for new health insurance sign-up.  Patient expressed appreciation for this.     Action/Plan:   Action/Plan Detail:   Anticipated DC Date:  05/23/2013     Status Recommendation to Physician:   Result of Recommendation:        Choice offered to / List presented to:            Status of service:  Completed, signed off  ED Comments:   ED Comments Detail:

## 2013-05-24 NOTE — ED Provider Notes (Signed)
Medical screening examination/treatment/procedure(s) were performed by non-physician practitioner and as supervising physician I was immediately available for consultation/collaboration.  Brittnie Lewey, MD 05/24/13 0754 

## 2013-05-28 ENCOUNTER — Ambulatory Visit: Payer: Medicaid Other | Admitting: Obstetrics and Gynecology

## 2013-06-02 ENCOUNTER — Emergency Department (HOSPITAL_COMMUNITY)
Admission: EM | Admit: 2013-06-02 | Discharge: 2013-06-03 | Disposition: A | Discharge status: Home / Self Care | Payer: Medicaid Other | Attending: Emergency Medicine | Admitting: Emergency Medicine

## 2013-06-02 ENCOUNTER — Encounter (HOSPITAL_COMMUNITY): Payer: Self-pay

## 2013-06-02 DIAGNOSIS — R110 Nausea: Secondary | ICD-10-CM | POA: Insufficient documentation

## 2013-06-02 DIAGNOSIS — R05 Cough: Secondary | ICD-10-CM | POA: Insufficient documentation

## 2013-06-02 DIAGNOSIS — K529 Noninfective gastroenteritis and colitis, unspecified: Secondary | ICD-10-CM

## 2013-06-02 DIAGNOSIS — R6883 Chills (without fever): Secondary | ICD-10-CM | POA: Insufficient documentation

## 2013-06-02 DIAGNOSIS — Z3202 Encounter for pregnancy test, result negative: Secondary | ICD-10-CM | POA: Insufficient documentation

## 2013-06-02 DIAGNOSIS — F17200 Nicotine dependence, unspecified, uncomplicated: Secondary | ICD-10-CM | POA: Insufficient documentation

## 2013-06-02 DIAGNOSIS — Z8659 Personal history of other mental and behavioral disorders: Secondary | ICD-10-CM | POA: Insufficient documentation

## 2013-06-02 DIAGNOSIS — K5289 Other specified noninfective gastroenteritis and colitis: Secondary | ICD-10-CM | POA: Insufficient documentation

## 2013-06-02 DIAGNOSIS — R059 Cough, unspecified: Secondary | ICD-10-CM | POA: Insufficient documentation

## 2013-06-02 DIAGNOSIS — M25569 Pain in unspecified knee: Secondary | ICD-10-CM | POA: Insufficient documentation

## 2013-06-02 LAB — URINALYSIS, ROUTINE W REFLEX MICROSCOPIC
Hgb urine dipstick: NEGATIVE
Protein, ur: NEGATIVE mg/dL
Specific Gravity, Urine: 1.015 (ref 1.005–1.030)
Urobilinogen, UA: 1 mg/dL (ref 0.0–1.0)
pH: 6 (ref 5.0–8.0)

## 2013-06-02 LAB — PREGNANCY, URINE: Preg Test, Ur: NEGATIVE

## 2013-06-02 MED ORDER — HYDROMORPHONE HCL PF 1 MG/ML IJ SOLN
1.0000 mg | Freq: Once | INTRAMUSCULAR | Status: AC
  Administered 2013-06-02: 1 mg via INTRAVENOUS
  Filled 2013-06-02: qty 1

## 2013-06-02 MED ORDER — SODIUM CHLORIDE 0.9 % IV BOLUS (SEPSIS)
1000.0000 mL | Freq: Once | INTRAVENOUS | Status: AC
  Administered 2013-06-02: 1000 mL via INTRAVENOUS

## 2013-06-02 MED ORDER — SODIUM CHLORIDE 0.9 % IV SOLN
INTRAVENOUS | Status: DC
  Administered 2013-06-03: 01:00:00 via INTRAVENOUS

## 2013-06-02 MED ORDER — ONDANSETRON HCL 4 MG/2ML IJ SOLN
4.0000 mg | Freq: Once | INTRAMUSCULAR | Status: AC
  Administered 2013-06-02: 4 mg via INTRAVENOUS
  Filled 2013-06-02: qty 2

## 2013-06-02 NOTE — ED Provider Notes (Signed)
CSN: [id number]     Arrival date & time 06/02/13  2208 History  This chart was scribed for Shelda Jakes, MD by Ardelia Mems, ED Scribe. This patient was seen in room APA11/[facility]11 and the patient's care was started at 11:12 PM.    Chief Complaint  Patient presents with  . Abdominal Pain    Patient is a 27 y.o. female presenting with abdominal pain. The history is provided by the patient. No language interpreter was used.  Abdominal Pain Pain location:  LLQ and RLQ Pain quality: sharp   Pain radiation: left rib area. Pain severity:  Severe ("10/10") Onset quality:  Sudden Duration:  2 hours Timing:  Constant Progression:  Unchanged Chronicity:  New Context: not trauma   Relieved by:  None tried Worsened by:  Nothing tried Ineffective treatments:  None tried Associated symptoms: chills, cough (baseline, from smoking), diarrhea, nausea and vomiting   Associated symptoms: no chest pain, no dysuria, no fever and no sore throat     HPI Comments: Joyce Klein is a 27 y.o. female brought by EMS to the Emergency Department complaining of constant, severe "sharp, 10/10" LLQ and RLQ abdominal pain that radiates to the left rib area which onset suddenly about 2 hours ago. Pt also reports associated nausea, emesis and diarrhea as associated symptoms today, which also onset suddenly. She denies the presence of blood in her emesis or stool. She states that her LNMP began 05/22/13. She states that she has had an episode of similar, less severe abdominal pain once in the past, which subsided on its own and which she was not evaluated for. She states that she has a history of nephrolithiasis. She denies dysuria, fever, sore throat or any other symptoms. Pt is a current evrey day smoker of 0.25 packs/day of 14 years.    Past Medical History  Diagnosis Date  . Depression   . Headache(784.0)   . Shortness of breath   . Blood transfusion without reported diagnosis 2005    PPH  . Bipolar 1  disorder   . Schizophrenia    Past Surgical History  Procedure Laterality Date  . Tubal ligation    . Tubal ligation     Family History  Problem Relation Age of Onset  . Other Neg Hx   . Irritable bowel syndrome Mother    History  Substance Use Topics  . Smoking status: Current Every Day Smoker -- 0.25 packs/day for 14 years    Types: Cigarettes  . Smokeless tobacco: Never Used  . Alcohol Use: Yes     Comment: occasioanal   OB History   Grav Para Term Preterm Abortions TAB SAB Ect Mult Living   14 2 2  12  12   2      Review of Systems  Constitutional: Positive for chills. Negative for fever.  HENT: Negative for sore throat and rhinorrhea.   Eyes: Visual disturbance: blurred vision with standing, past 2 months.  Respiratory: Positive for cough (baseline, from smoking).   Cardiovascular: Negative for chest pain and leg swelling.  Gastrointestinal: Positive for nausea, vomiting, abdominal pain and diarrhea.  Genitourinary: Negative for dysuria.  Musculoskeletal: Positive for arthralgias (left knee).  Skin: Negative for rash.  Neurological: Negative for headaches.  Hematological: Does not bruise/bleed easily.  Psychiatric/Behavioral: Negative for confusion.  All other systems reviewed and are negative.   Allergies  Coconut oil; Other; and Bee venom  Home Medications   Current Outpatient Rx  Name  Route  Sig  Dispense  Refill  . cyclobenzaprine (FLEXERIL) 10 MG tablet   Oral   Take 1 tablet (10 mg total) by mouth 2 (two) times daily as needed for muscle spasms.   20 tablet   0   . albuterol (PROVENTIL HFA;VENTOLIN HFA) 108 (90 BASE) MCG/ACT inhaler   Inhalation   Inhale 2 puffs into the lungs every 6 (six) hours as needed. For shortness of breath         . HYDROcodone-acetaminophen (NORCO/VICODIN) 5-325 MG per tablet   Oral   Take 1-2 tablets by mouth every 6 (six) hours as needed for pain.   10 tablet   0   . loperamide (IMODIUM A-D) 2 MG tablet    Oral   Take 1 tablet (2 mg total) by mouth 4 (four) times daily as needed for diarrhea or loose stools.   30 tablet   0   . ondansetron (ZOFRAN ODT) 4 MG disintegrating tablet   Oral   Take 1 tablet (4 mg total) by mouth every 8 (eight) hours as needed.   10 tablet   0   . promethazine (PHENERGAN) 25 MG tablet   Oral   Take 1 tablet (25 mg total) by mouth every 6 (six) hours as needed for nausea.   12 tablet   0    Triage Vitals: BP 121/62  Pulse 91  Temp(Src) 97.3 F (36.3 C) (Oral)  Resp 20  Ht 5\' 3"  (1.6 m)  Wt 145 lb (65.772 kg)  BMI 25.69 kg/m2  SpO2 99%  LMP 05/19/2013  Physical Exam  Nursing note and vitals reviewed. Constitutional: She is oriented to person, place, and time. She appears well-developed and well-nourished. No distress.  HENT:  Head: Normocephalic and atraumatic.  Mucous membranes moist.  Eyes: EOM are normal.  Sclera normal.  Neck: Neck supple. No tracheal deviation present.  Cardiovascular: Normal rate, regular rhythm and normal heart sounds.   No murmur heard. Pulmonary/Chest: Effort normal and breath sounds normal. No respiratory distress. She has no wheezes.  Abdominal: Soft. Bowel sounds are normal. She exhibits no distension. There is no tenderness.  Musculoskeletal: Normal range of motion.  No edema in bilateral lower legs.  Neurological: She is alert and oriented to person, place, and time.  Skin: Skin is warm and dry.  Psychiatric: She has a normal mood and affect. Her behavior is normal.    ED Course  Procedures (including critical care time)  DIAGNOSTIC STUDIES: Oxygen Saturation is 99% on RA, normal by my interpretation.    COORDINATION OF CARE: 11:19 PM- Discussed plan for pt to receive IV fluids, Zofran and Dilaudid. Also discussed plan for diagnostic lab work. Pt advised of plan for treatment and pt agrees.  Medications  0.9 %  sodium chloride infusion ( Intravenous New Bag/Given 06/03/13 0102)  sodium chloride 0.9 %  bolus 1,000 mL (0 mLs Intravenous Stopped 06/03/13 0035)  ondansetron (ZOFRAN) injection 4 mg (4 mg Intravenous Given 06/02/13 2355)  HYDROmorphone (DILAUDID) injection 1 mg (1 mg Intravenous Given 06/02/13 2355)  iohexol (OMNIPAQUE) 300 MG/ML solution 100 mL (100 mLs Intravenous Contrast Given 06/03/13 0223)  HYDROmorphone (DILAUDID) injection 1 mg (1 mg Intravenous Given 06/03/13 0102)  iohexol (OMNIPAQUE) 300 MG/ML solution 50 mL (50 mLs Oral Contrast Given 06/03/13 0100)   Labs Review Labs Reviewed  URINALYSIS, ROUTINE W REFLEX MICROSCOPIC - Abnormal; Notable for the following:    Bilirubin Urine SMALL (*)    Ketones, ur TRACE (*)  All other components within normal limits  COMPREHENSIVE METABOLIC PANEL - Abnormal; Notable for the following:    Sodium 133 (*)    Potassium 3.4 (*)    Glucose, Bld 124 (*)    All other components within normal limits  CBC WITH DIFFERENTIAL - Abnormal; Notable for the following:    WBC 18.9 (*)    Hemoglobin 15.3 (*)    Neutrophils Relative % 84 (*)    Neutro Abs 15.8 (*)    Lymphocytes Relative 9 (*)    Monocytes Absolute 1.2 (*)    All other components within normal limits  PREGNANCY, URINE  LIPASE, BLOOD   Results for orders placed during the hospital encounter of 06/02/13  URINALYSIS, ROUTINE W REFLEX MICROSCOPIC      Result Value Range   Color, Urine YELLOW  YELLOW   APPearance CLEAR  CLEAR   Specific Gravity, Urine 1.015  1.005 - 1.030   pH 6.0  5.0 - 8.0   Glucose, UA NEGATIVE  NEGATIVE mg/dL   Hgb urine dipstick NEGATIVE  NEGATIVE   Bilirubin Urine SMALL (*) NEGATIVE   Ketones, ur TRACE (*) NEGATIVE mg/dL   Protein, ur NEGATIVE  NEGATIVE mg/dL   Urobilinogen, UA 1.0  0.0 - 1.0 mg/dL   Nitrite NEGATIVE  NEGATIVE   Leukocytes, UA NEGATIVE  NEGATIVE  PREGNANCY, URINE      Result Value Range   Preg Test, Ur NEGATIVE  NEGATIVE  COMPREHENSIVE METABOLIC PANEL      Result Value Range   Sodium 133 (*) 135 - 145 mEq/L   Potassium 3.4 (*)  3.5 - 5.1 mEq/L   Chloride 98  96 - 112 mEq/L   CO2 23  19 - 32 mEq/L   Glucose, Bld 124 (*) 70 - 99 mg/dL   BUN 16  6 - 23 mg/dL   Creatinine, Ser 1.61  0.50 - 1.10 mg/dL   Calcium 9.6  8.4 - 09.6 mg/dL   Total Protein 7.5  6.0 - 8.3 g/dL   Albumin 4.3  3.5 - 5.2 g/dL   AST 18  0 - 37 U/L   ALT 10  0 - 35 U/L   Alkaline Phosphatase 62  39 - 117 U/L   Total Bilirubin 0.4  0.3 - 1.2 mg/dL   GFR calc non Af Amer >90  >90 mL/min   GFR calc Af Amer >90  >90 mL/min  LIPASE, BLOOD      Result Value Range   Lipase 33  11 - 59 U/L  CBC WITH DIFFERENTIAL      Result Value Range   WBC 18.9 (*) 4.0 - 10.5 K/uL   RBC 4.50  3.87 - 5.11 MIL/uL   Hemoglobin 15.3 (*) 12.0 - 15.0 g/dL   HCT 04.5  40.9 - 81.1 %   MCV 97.6  78.0 - 100.0 fL   MCH 34.0  26.0 - 34.0 pg   MCHC 34.9  30.0 - 36.0 g/dL   RDW 91.4  78.2 - 95.6 %   Platelets 210  150 - 400 K/uL   Neutrophils Relative % 84 (*) 43 - 77 %   Neutro Abs 15.8 (*) 1.7 - 7.7 K/uL   Lymphocytes Relative 9 (*) 12 - 46 %   Lymphs Abs 1.8  0.7 - 4.0 K/uL   Monocytes Relative 7  3 - 12 %   Monocytes Absolute 1.2 (*) 0.1 - 1.0 K/uL   Eosinophils Relative 0  0 - 5 %   Eosinophils  Absolute 0.1  0.0 - 0.7 K/uL   Basophils Relative 0  0 - 1 %   Basophils Absolute 0.0  0.0 - 0.1 K/uL    Imaging Review Ct Abdomen Pelvis W Contrast  06/03/2013   *RADIOLOGY REPORT*  Clinical Data: Abdominal pain.  CT ABDOMEN AND PELVIS WITH CONTRAST  Technique:  Multidetector CT imaging of the abdomen and pelvis was performed following the standard protocol during bolus administration of intravenous contrast.  Contrast: 50mL OMNIPAQUE IOHEXOL 300 MG/ML  SOLN, OMNIPAQUE IOHEXOL 300 MG/ML  SOLN  Comparison: CT of the abdomen and pelvis February 24, 2013  Findings: Limited view of the lung bases are clear.  Included heart and pericardium are unremarkable.  Stomach, small bowel are normal in course and caliber without wall thickening or inflammatory changes though,  sensitivity may be decreased as contrast has yet to reach the large bowel.  Mild descending colonic wall thickening, for example coronal 32/86. Mild amount of retained large bowel stool.  No intraperitoneal free fluid or free air.  The liver, spleen, pancreas, gallbladder and adrenal glands are unremarkable and unchanged.  Kidneys are normal, unchanged.  Great vessels are normal in course and caliber. The retrograde filling of left gonadal vein seen previously is less apparent due to bolus timing.  Urinary bladder is decompressed and overall unremarkable.  Phleboliths in the pelvis.  Less conspicuous appearance of right adnexal presumed involuting corpus luteal cyst.  Mild levoscoliosis.  Soft tissues are not suspicious.  IMPRESSION: Mild descending colonic wall thickening which may be seen with colitis ( infectious or inflammatory) though, contrast has yet to reach the large bowel, this may be part due to underdistension. Mild amount of retained large bowel stool.   Original Report Authenticated By: Awilda Metro    MDM   1. Gastroenteritis    Labs and CT scans reviewed. CT scan shows evidence of some colitis based on patient's symptoms are more consistent with a gastroenteritis. Will treat symptomatically. Patient was significant leukocytosis but nontoxic no acute distress. Patient improved with fluids anti-medics and pain medicine in the emergency department.  Patient has been seen frequently for various pain complaints. There is some concern that there could be necrotic seeking but based on her CT scan will treat as if gastroenteritis. Patient also given 10 tablets of hydrocodone for the crampy abdominal pain. It is possible the patient's symptoms are related to opiate drug withdrawal.   I personally performed the services described in this documentation, which was scribed in my presence. The recorded information has been reviewed and is accurate.     Shelda Jakes, MD 06/03/13 [phone]

## 2013-06-02 NOTE — ED Notes (Signed)
Abdominal pain started suddenly about 45 minutes ago. Hurting across my waist per pt. EMS states that it was reported she was having vomiting and diarrhea prior to their arrival, none en route to the ER.

## 2013-06-03 ENCOUNTER — Emergency Department (HOSPITAL_COMMUNITY): Payer: Medicaid Other

## 2013-06-03 LAB — CBC WITH DIFFERENTIAL/PLATELET
Lymphocytes Relative: 9 % — ABNORMAL LOW (ref 12–46)
Lymphs Abs: 1.8 10*3/uL (ref 0.7–4.0)
Neutro Abs: 15.8 10*3/uL — ABNORMAL HIGH (ref 1.7–7.7)
Neutrophils Relative %: 84 % — ABNORMAL HIGH (ref 43–77)
Platelets: 210 10*3/uL (ref 150–400)
RBC: 4.5 MIL/uL (ref 3.87–5.11)
WBC: 18.9 10*3/uL — ABNORMAL HIGH (ref 4.0–10.5)

## 2013-06-03 LAB — COMPREHENSIVE METABOLIC PANEL
AST: 18 U/L (ref 0–37)
CO2: 23 mEq/L (ref 19–32)
Calcium: 9.6 mg/dL (ref 8.4–10.5)
Creatinine, Ser: 0.71 mg/dL (ref 0.50–1.10)
GFR calc Af Amer: >90 mL/min (ref >90)
GFR calc non Af Amer: >90 mL/min (ref >90)
Glucose, Bld: 124 mg/dL — ABNORMAL HIGH (ref 70–99)
Sodium: 133 mEq/L — ABNORMAL LOW (ref 135–145)
Total Bilirubin: 0.4 mg/dL (ref 0.3–1.2)

## 2013-06-03 LAB — LIPASE, BLOOD: Lipase: 33 U/L (ref 11–59)

## 2013-06-03 MED ORDER — PROMETHAZINE HCL 25 MG PO TABS: 25.0000 mg | ORAL_TABLET | Freq: Four times a day (QID) | ORAL | Status: DC | PRN

## 2013-06-03 MED ORDER — IOHEXOL 300 MG/ML  SOLN
50.0000 mL | Freq: Once | INTRAMUSCULAR | Status: AC | PRN
  Administered 2013-06-03: 50 mL via ORAL

## 2013-06-03 MED ORDER — IOHEXOL 300 MG/ML  SOLN
100.0000 mL | Freq: Once | INTRAMUSCULAR | Status: AC | PRN
  Administered 2013-06-03: 100 mL via INTRAVENOUS

## 2013-06-03 MED ORDER — HYDROCODONE-ACETAMINOPHEN 5-325 MG PO TABS: 1.0000 | ORAL_TABLET | Freq: Four times a day (QID) | ORAL | Status: DC | PRN

## 2013-06-03 MED ORDER — LOPERAMIDE HCL 2 MG PO TABS: 2.0000 mg | ORAL_TABLET | Freq: Four times a day (QID) | ORAL | Status: DC | PRN

## 2013-06-03 MED ORDER — ONDANSETRON 4 MG PO TBDP: 4.0000 mg | ORAL_TABLET | Freq: Three times a day (TID) | ORAL | Status: DC | PRN

## 2013-06-03 MED ORDER — HYDROMORPHONE HCL PF 1 MG/ML IJ SOLN
1.0000 mg | Freq: Once | INTRAMUSCULAR | Status: AC
  Administered 2013-06-03: 1 mg via INTRAVENOUS
  Filled 2013-06-03: qty 1

## 2013-06-03 NOTE — ED Notes (Signed)
Pt stated she could not wait any longer for discharge paperwork. Explained to pt importance of waiting for discharge instructions and prescriptions. Pt verbalized understanding but stated "I need a cigarette real bad and my boyfriend is going to drive me crazy waiting to go home".

## 2013-06-23 ENCOUNTER — Emergency Department (HOSPITAL_COMMUNITY)
Admission: EM | Admit: 2013-06-23 | Discharge: 2013-06-23 | Disposition: A | Discharge status: Home / Self Care | Payer: Medicaid Other | Attending: Emergency Medicine | Admitting: Emergency Medicine

## 2013-06-23 ENCOUNTER — Emergency Department (HOSPITAL_COMMUNITY): Payer: Medicaid Other

## 2013-06-23 ENCOUNTER — Encounter (HOSPITAL_COMMUNITY): Payer: Self-pay | Admitting: Emergency Medicine

## 2013-06-23 DIAGNOSIS — Y929 Unspecified place or not applicable: Secondary | ICD-10-CM | POA: Insufficient documentation

## 2013-06-23 DIAGNOSIS — S0083XA Contusion of other part of head, initial encounter: Secondary | ICD-10-CM

## 2013-06-23 DIAGNOSIS — S0003XA Contusion of scalp, initial encounter: Secondary | ICD-10-CM | POA: Insufficient documentation

## 2013-06-23 DIAGNOSIS — Y9389 Activity, other specified: Secondary | ICD-10-CM | POA: Insufficient documentation

## 2013-06-23 DIAGNOSIS — Z8659 Personal history of other mental and behavioral disorders: Secondary | ICD-10-CM | POA: Insufficient documentation

## 2013-06-23 DIAGNOSIS — R42 Dizziness and giddiness: Secondary | ICD-10-CM | POA: Insufficient documentation

## 2013-06-23 DIAGNOSIS — F17200 Nicotine dependence, unspecified, uncomplicated: Secondary | ICD-10-CM | POA: Insufficient documentation

## 2013-06-23 DIAGNOSIS — R1110 Vomiting, unspecified: Secondary | ICD-10-CM | POA: Insufficient documentation

## 2013-06-23 DIAGNOSIS — IMO0002 Reserved for concepts with insufficient information to code with codable children: Secondary | ICD-10-CM | POA: Insufficient documentation

## 2013-06-23 MED ORDER — HYDROCODONE-ACETAMINOPHEN 5-325 MG PO TABS
1.0000 | ORAL_TABLET | Freq: Once | ORAL | Status: AC
  Administered 2013-06-23: 1 via ORAL
  Filled 2013-06-23: qty 1

## 2013-06-23 MED ORDER — FLUORESCEIN SODIUM 1 MG OP STRP
1.0000 | ORAL_STRIP | Freq: Once | OPHTHALMIC | Status: AC
  Administered 2013-06-23: 1 via OPHTHALMIC
  Filled 2013-06-23: qty 1

## 2013-06-23 MED ORDER — TETRACAINE HCL 0.5 % OP SOLN
1.0000 [drp] | Freq: Once | OPHTHALMIC | Status: AC
  Administered 2013-06-23: 1 [drp] via OPHTHALMIC
  Filled 2013-06-23: qty 2
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
## 2013-06-23 NOTE — ED Notes (Signed)
Patient transported to CT 

## 2013-06-24 NOTE — ED Provider Notes (Signed)
Medical screening examination/treatment/procedure(s) were performed by non-physician practitioner and as supervising physician I was immediately available for consultation/collaboration.  Yuktha Kerchner B. Iriel Nason, MD 06/24/13 2250 

## 2013-07-01 ENCOUNTER — Emergency Department (HOSPITAL_COMMUNITY): Payer: Self-pay

## 2013-07-01 ENCOUNTER — Encounter (HOSPITAL_COMMUNITY): Payer: Self-pay | Admitting: Emergency Medicine

## 2013-07-01 ENCOUNTER — Emergency Department (HOSPITAL_COMMUNITY)
Admission: EM | Admit: 2013-07-01 | Discharge: 2013-07-01 | Disposition: A | Discharge status: Home / Self Care | Payer: Self-pay | Attending: Emergency Medicine | Admitting: Emergency Medicine

## 2013-07-01 DIAGNOSIS — S0990XA Unspecified injury of head, initial encounter: Secondary | ICD-10-CM | POA: Insufficient documentation

## 2013-07-01 DIAGNOSIS — Z792 Long term (current) use of antibiotics: Secondary | ICD-10-CM | POA: Insufficient documentation

## 2013-07-01 DIAGNOSIS — Z79899 Other long term (current) drug therapy: Secondary | ICD-10-CM | POA: Insufficient documentation

## 2013-07-01 DIAGNOSIS — R102 Pelvic and perineal pain: Secondary | ICD-10-CM

## 2013-07-01 DIAGNOSIS — S298XXA Other specified injuries of thorax, initial encounter: Secondary | ICD-10-CM | POA: Insufficient documentation

## 2013-07-01 DIAGNOSIS — M791 Myalgia, unspecified site: Secondary | ICD-10-CM

## 2013-07-01 DIAGNOSIS — Z3202 Encounter for pregnancy test, result negative: Secondary | ICD-10-CM | POA: Insufficient documentation

## 2013-07-01 DIAGNOSIS — S79919A Unspecified injury of unspecified hip, initial encounter: Secondary | ICD-10-CM | POA: Insufficient documentation

## 2013-07-01 DIAGNOSIS — R112 Nausea with vomiting, unspecified: Secondary | ICD-10-CM | POA: Insufficient documentation

## 2013-07-01 DIAGNOSIS — Z8659 Personal history of other mental and behavioral disorders: Secondary | ICD-10-CM | POA: Insufficient documentation

## 2013-07-01 DIAGNOSIS — F17200 Nicotine dependence, unspecified, uncomplicated: Secondary | ICD-10-CM | POA: Insufficient documentation

## 2013-07-01 DIAGNOSIS — IMO0002 Reserved for concepts with insufficient information to code with codable children: Secondary | ICD-10-CM | POA: Insufficient documentation

## 2013-07-01 MED ORDER — METHOCARBAMOL 500 MG PO TABS: 500.0000 mg | ORAL_TABLET | Freq: Two times a day (BID) | ORAL | Status: DC

## 2013-07-01 MED ORDER — NAPROXEN 250 MG PO TABS
375.0000 mg | ORAL_TABLET | Freq: Once | ORAL | Status: AC
  Administered 2013-07-01: 375 mg via ORAL
  Filled 2013-07-01: qty 2

## 2013-07-01 MED ORDER — ALBUTEROL SULFATE (5 MG/ML) 0.5% IN NEBU
5.0000 mg | INHALATION_SOLUTION | Freq: Once | RESPIRATORY_TRACT | Status: AC
  Administered 2013-07-01: 5 mg via RESPIRATORY_TRACT
  Filled 2013-07-01: qty 1

## 2013-07-01 MED ORDER — SODIUM CHLORIDE 0.9 % IV BOLUS (SEPSIS)
1000.0000 mL | Freq: Once | INTRAVENOUS | Status: AC
  Administered 2013-07-01: 1000 mL via INTRAVENOUS

## 2013-07-01 MED ORDER — OXYCODONE-ACETAMINOPHEN 5-325 MG PO TABS: 1.0000 | ORAL_TABLET | Freq: Three times a day (TID) | ORAL | Status: DC | PRN

## 2013-07-01 MED ORDER — HYDROMORPHONE HCL PF 1 MG/ML IJ SOLN
1.0000 mg | Freq: Once | INTRAMUSCULAR | Status: AC
  Administered 2013-07-01: 1 mg via INTRAVENOUS
  Filled 2013-07-01: qty 1

## 2013-07-01 MED ORDER — DIAZEPAM 5 MG PO TABS
5.0000 mg | ORAL_TABLET | Freq: Once | ORAL | Status: AC
  Administered 2013-07-01: 5 mg via ORAL
  Filled 2013-07-01: qty 1

## 2013-07-01 MED ORDER — ALBUTEROL SULFATE HFA 108 (90 BASE) MCG/ACT IN AERS
2.0000 | INHALATION_SPRAY | Freq: Once | RESPIRATORY_TRACT | Status: AC
  Administered 2013-07-01: 2 via RESPIRATORY_TRACT
  Filled 2013-07-01: qty 6.7

## 2013-07-01 MED ORDER — ONDANSETRON HCL 4 MG/2ML IJ SOLN
4.0000 mg | Freq: Once | INTRAMUSCULAR | Status: AC
  Administered 2013-07-01: 4 mg via INTRAVENOUS
  Filled 2013-07-01: qty 2
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
## 2013-07-01 NOTE — ED Notes (Signed)
Patient repeatedly is ringing the call bell for pain medication. I politely told her that the doctor would order additional medications as she could. Based on the length of time since her last dose and her vital signs.
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
## 2013-07-06 ENCOUNTER — Encounter (HOSPITAL_COMMUNITY): Payer: Self-pay | Admitting: Emergency Medicine

## 2013-07-06 ENCOUNTER — Emergency Department (HOSPITAL_COMMUNITY)
Admission: EM | Admit: 2013-07-06 | Discharge: 2013-07-06 | Disposition: A | Discharge status: Home / Self Care | Payer: Medicaid Other | Attending: Emergency Medicine | Admitting: Emergency Medicine

## 2013-07-06 DIAGNOSIS — R55 Syncope and collapse: Secondary | ICD-10-CM | POA: Insufficient documentation

## 2013-07-06 DIAGNOSIS — R358 Other polyuria: Secondary | ICD-10-CM | POA: Insufficient documentation

## 2013-07-06 DIAGNOSIS — Z792 Long term (current) use of antibiotics: Secondary | ICD-10-CM | POA: Insufficient documentation

## 2013-07-06 DIAGNOSIS — R319 Hematuria, unspecified: Secondary | ICD-10-CM | POA: Insufficient documentation

## 2013-07-06 DIAGNOSIS — Z3202 Encounter for pregnancy test, result negative: Secondary | ICD-10-CM | POA: Insufficient documentation

## 2013-07-06 DIAGNOSIS — R1084 Generalized abdominal pain: Secondary | ICD-10-CM | POA: Insufficient documentation

## 2013-07-06 DIAGNOSIS — F17200 Nicotine dependence, unspecified, uncomplicated: Secondary | ICD-10-CM | POA: Insufficient documentation

## 2013-07-06 DIAGNOSIS — Z8659 Personal history of other mental and behavioral disorders: Secondary | ICD-10-CM | POA: Insufficient documentation

## 2013-07-06 DIAGNOSIS — R109 Unspecified abdominal pain: Secondary | ICD-10-CM

## 2013-07-06 DIAGNOSIS — R3589 Other polyuria: Secondary | ICD-10-CM | POA: Insufficient documentation

## 2013-07-06 DIAGNOSIS — Z79899 Other long term (current) drug therapy: Secondary | ICD-10-CM | POA: Insufficient documentation

## 2013-07-06 DIAGNOSIS — R112 Nausea with vomiting, unspecified: Secondary | ICD-10-CM | POA: Insufficient documentation

## 2013-07-06 LAB — URINALYSIS, ROUTINE W REFLEX MICROSCOPIC
Glucose, UA: NEGATIVE mg/dL
Ketones, ur: 15 mg/dL — AB
Leukocytes, UA: NEGATIVE
Nitrite: NEGATIVE
Protein, ur: NEGATIVE mg/dL
Specific Gravity, Urine: 1.02 (ref 1.005–1.030)
pH: 5.5 (ref 5.0–8.0)

## 2013-07-06 LAB — COMPREHENSIVE METABOLIC PANEL
ALT: 11 U/L (ref 0–35)
AST: 19 U/L (ref 0–37)
Albumin: 4.2 g/dL (ref 3.5–5.2)
Calcium: 9.3 mg/dL (ref 8.4–10.5)
Chloride: 104 mEq/L (ref 96–112)
Creatinine, Ser: 0.67 mg/dL (ref 0.50–1.10)
GFR calc non Af Amer: >90 mL/min (ref >90)
Potassium: 4.2 mEq/L (ref 3.5–5.1)
Sodium: 138 mEq/L (ref 135–145)
Total Protein: 7.9 g/dL (ref 6.0–8.3)

## 2013-07-06 LAB — RAPID URINE DRUG SCREEN, HOSP PERFORMED
Amphetamines: NOT DETECTED
Barbiturates: NOT DETECTED
Cocaine: NOT DETECTED
Opiates: NOT DETECTED
Tetrahydrocannabinol: POSITIVE — AB

## 2013-07-06 LAB — CBC WITH DIFFERENTIAL/PLATELET
Basophils Absolute: 0 10*3/uL (ref 0.0–0.1)
Basophils Relative: 0 % (ref 0–1)
Eosinophils Absolute: 0.3 10*3/uL (ref 0.0–0.7)
Eosinophils Relative: 2 % (ref 0–5)
HCT: 44.6 % (ref 36.0–46.0)
MCHC: 35 g/dL (ref 30.0–36.0)
MCV: 97.4 fL (ref 78.0–100.0)
Monocytes Absolute: 1 10*3/uL (ref 0.1–1.0)
Platelets: 212 10*3/uL (ref 150–400)
RDW: 12.2 % (ref 11.5–15.5)

## 2013-07-06 LAB — PREGNANCY, URINE: Preg Test, Ur: NEGATIVE

## 2013-07-06 MED ORDER — ONDANSETRON HCL 4 MG PO TABS
4.0000 mg | ORAL_TABLET | Freq: Once | ORAL | Status: AC
  Administered 2013-07-06: 4 mg via ORAL
  Filled 2013-07-06: qty 1

## 2013-07-06 MED ORDER — ACETAMINOPHEN 325 MG PO TABS
650.0000 mg | ORAL_TABLET | Freq: Once | ORAL | Status: AC
  Administered 2013-07-06: 650 mg via ORAL
  Filled 2013-07-06: qty 2

## 2013-07-06 MED ORDER — DIPHENHYDRAMINE HCL 25 MG PO CAPS
25.0000 mg | ORAL_CAPSULE | Freq: Once | ORAL | Status: AC
  Administered 2013-07-06: 25 mg via ORAL
  Filled 2013-07-06: qty 1
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
## 2013-07-06 NOTE — ED Notes (Signed)
C/o tingling down right leg that started @ 1415

## 2013-07-06 NOTE — ED Notes (Signed)
Pt. Reporting nausea. States the nausea is caused by her migraine.

## 2013-07-06 NOTE — ED Provider Notes (Signed)
CSN: [id number]     Arrival date & time 07/06/13  1529 History   First MD Initiated Contact with Patient 07/06/13 1839     Chief Complaint  Patient presents with  . Abdominal Pain  . Emesis   (Consider location/radiation/quality/duration/timing/severity/associated sxs/prior Treatment) HPI Comments: 27 year old  female presents emergency department with chief complaint of abdominal pain. She reports nausea and vomiting. Patient was recently evaluated in October for similar chief complaints and she had an extensive workup to include CT abdomen and pelvis which did not reveal acute findings. IMPRESSION: Jun 04, 2013 Mild descending colonic wall thickening which may be seen with colitis ( infectious or inflammatory) though, contrast has yet to reach the large bowel, this may be part due to underdistension. Mild amount of retained large bowel stool.  At that visit she was noted to have leukocytosis as well. She was discharged with treatment for gastroenteritis   Patient is a 27 y.o. female presenting with abdominal pain and vomiting. The history is provided by the patient.  Abdominal Pain Pain location:  Generalized Pain quality: aching, fullness and sharp   Pain radiates to:  Does not radiate Pain severity:  Moderate Onset quality:  Gradual Duration:  5 hours Timing:  Intermittent Progression:  Waxing and waning Chronicity:  Recurrent Context: eating   Context: not alcohol use and not diet changes   Context comment:  Last meal 0800 - crackers Relieved by:  None tried Worsened by:  Nothing tried Ineffective treatments:  None tried Associated symptoms: hematuria, nausea and vomiting   Associated symptoms: no anorexia and no chest pain   Associated symptoms comment:  One episode of vomiting Risk factors: no alcohol abuse, no aspirin use, not elderly, has not had multiple surgeries, no NSAID use, not obese and not pregnant   Risk factors comment:  LMP - 1 month ago Emesis Associated  symptoms: abdominal pain     Past Medical History  Diagnosis Date  . Depression   . Headache(784.0)   . Shortness of breath   . Blood transfusion without reported diagnosis 2005    PPH  . Bipolar 1 disorder   . Schizophrenia    Past Surgical History  Procedure Laterality Date  . Tubal ligation    . Tubal ligation     Family History  Problem Relation Age of Onset  . Other Neg Hx   . Irritable bowel syndrome Mother    History  Substance Use Topics  . Smoking status: Current Every Day Smoker -- 0.25 packs/day for 14 years    Types: Cigarettes  . Smokeless tobacco: Never Used  . Alcohol Use: Yes     Comment: occasioanal   OB History   Grav Para Term Preterm Abortions TAB SAB Ect Mult Living   14 2 2  12  12   2      Review of Systems  HENT: Negative.   Respiratory: Negative.   Cardiovascular: Negative for chest pain, palpitations and leg swelling.       Pt states she "passed out" in the car on the way here.  Has a history of syncope in the past.   Gastrointestinal: Positive for nausea, vomiting and abdominal pain. Negative for anorexia.  Endocrine: Positive for polyuria. Negative for polydipsia and polyphagia.  Genitourinary: Positive for hematuria. Negative for urgency, frequency, flank pain, genital sores, menstrual problem and pelvic pain.       Pt denies vaginal symptoms and declines a pelvic exam  Musculoskeletal: Negative.   Skin: Negative.  Neurological: Positive for syncope. Negative for dizziness, tremors, seizures, facial asymmetry, speech difficulty, weakness, light-headedness and numbness.  Psychiatric/Behavioral: Negative.     Allergies  Bee venom; Coconut oil; and Other  Home Medications   Current Outpatient Rx  Name  Route  Sig  Dispense  Refill  . albuterol (PROVENTIL HFA;VENTOLIN HFA) 108 (90 BASE) MCG/ACT inhaler   Inhalation   Inhale 2 puffs into the lungs every 6 (six) hours as needed. For shortness of breath         . doxycycline  (VIBRAMYCIN) 100 MG capsule   Oral   Take 100 mg by mouth 2 (two) times daily.         . methocarbamol (ROBAXIN) 500 MG tablet   Oral   Take 1 tablet (500 mg total) by mouth 2 (two) times daily.   20 tablet   0   . Multiple Vitamin (MULTIVITAMIN WITH MINERALS) TABS tablet   Oral   Take 1 tablet by mouth daily.         Marland Kitchen oxyCODONE-acetaminophen (PERCOCET/ROXICET) 5-325 MG per tablet   Oral   Take 1 tablet by mouth every 8 (eight) hours as needed for pain.   11 tablet   0   . promethazine (PHENERGAN) 25 MG tablet   Oral   Take 25 mg by mouth every 6 (six) hours as needed for nausea.          BP 101/69  Pulse 97  Temp(Src) 98.4 F (36.9 C) (Oral)  Resp 16  SpO2 99%  LMP 06/10/2013 Physical Exam  Constitutional: She is oriented to person, place, and time. She appears well-developed and well-nourished.  HENT:  Head: Normocephalic and atraumatic.  Eyes: Conjunctivae are normal. Right eye exhibits no discharge. Left eye exhibits no discharge.  Neck: Normal range of motion. Neck supple. No JVD present.  Cardiovascular: Normal rate and regular rhythm.  Exam reveals no friction rub.   No murmur heard. Pulmonary/Chest: Effort normal and breath sounds normal. No stridor.  Abdominal: Soft. Bowel sounds are normal. She exhibits no distension and no mass. There is tenderness. There is no rebound and no guarding.  Mild diffuse TTP.  No ttp at McBurney's.  Neg Murphys.  No HSM.  Neg heel tap.  Neg obturator.  No Suprapubic TTP.    Musculoskeletal: Normal range of motion. She exhibits no edema and no tenderness.  Neurological: She is alert and oriented to person, place, and time. She has normal reflexes.  Skin: Skin is warm and dry.    ED Course  Procedures (including critical care time) Labs Review Results for orders placed during the hospital encounter of 07/06/13  CBC WITH DIFFERENTIAL      Result Value Range   WBC 17.0 (*) 4.0 - 10.5 K/uL   RBC 4.58  3.87 - 5.11 MIL/uL    Hemoglobin 15.6 (*) 12.0 - 15.0 g/dL   HCT 19.1  47.8 - 29.5 %   MCV 97.4  78.0 - 100.0 fL   MCH 34.1 (*) 26.0 - 34.0 pg   MCHC 35.0  30.0 - 36.0 g/dL   RDW 62.1  30.8 - 65.7 %   Platelets 212  150 - 400 K/uL   Neutrophils Relative % 84 (*) 43 - 77 %   Neutro Abs 14.2 (*) 1.7 - 7.7 K/uL   Lymphocytes Relative 9 (*) 12 - 46 %   Lymphs Abs 1.5  0.7 - 4.0 K/uL   Monocytes Relative 6  3 - 12 %  Monocytes Absolute 1.0  0.1 - 1.0 K/uL   Eosinophils Relative 2  0 - 5 %   Eosinophils Absolute 0.3  0.0 - 0.7 K/uL   Basophils Relative 0  0 - 1 %   Basophils Absolute 0.0  0.0 - 0.1 K/uL  COMPREHENSIVE METABOLIC PANEL      Result Value Range   Sodium 138  135 - 145 mEq/L   Potassium 4.2  3.5 - 5.1 mEq/L   Chloride 104  96 - 112 mEq/L   CO2 24  19 - 32 mEq/L   Glucose, Bld 135 (*) 70 - 99 mg/dL   BUN 10  6 - 23 mg/dL   Creatinine, Ser 1.61  0.50 - 1.10 mg/dL   Calcium 9.3  8.4 - 09.6 mg/dL   Total Protein 7.9  6.0 - 8.3 g/dL   Albumin 4.2  3.5 - 5.2 g/dL   AST 19  0 - 37 U/L   ALT 11  0 - 35 U/L   Alkaline Phosphatase 66  39 - 117 U/L   Total Bilirubin 0.3  0.3 - 1.2 mg/dL   GFR calc non Af Amer >90  >90 mL/min   GFR calc Af Amer >90  >90 mL/min  LIPASE, BLOOD      Result Value Range   Lipase 25  11 - 59 U/L  URINALYSIS, ROUTINE W REFLEX MICROSCOPIC      Result Value Range   Color, Urine YELLOW  YELLOW   APPearance HAZY (*) CLEAR   Specific Gravity, Urine 1.020  1.005 - 1.030   pH 5.5  5.0 - 8.0   Glucose, UA NEGATIVE  NEGATIVE mg/dL   Hgb urine dipstick NEGATIVE  NEGATIVE   Bilirubin Urine SMALL (*) NEGATIVE   Ketones, ur 15 (*) NEGATIVE mg/dL   Protein, ur NEGATIVE  NEGATIVE mg/dL   Urobilinogen, UA 1.0  0.0 - 1.0 mg/dL   Nitrite NEGATIVE  NEGATIVE   Leukocytes, UA NEGATIVE  NEGATIVE  PREGNANCY, URINE      Result Value Range   Preg Test, Ur NEGATIVE  NEGATIVE  URINE RAPID DRUG SCREEN (HOSP PERFORMED)      Result Value Range   Opiates NONE DETECTED  NONE DETECTED    Cocaine NONE DETECTED  NONE DETECTED   Benzodiazepines POSITIVE (*) NONE DETECTED   Amphetamines NONE DETECTED  NONE DETECTED   Tetrahydrocannabinol POSITIVE (*) NONE DETECTED   Barbiturates NONE DETECTED  NONE DETECTED    MDM  No diagnosis found. 27 year old female presents emergency department with chief complaint of abdominal pain. This is a chronic issue for this patient and she has been evaluated multiple times in the past. Recently she was seen in early October and had a negative CT of the abdomen. She does have a leukocytosis which is somewhat concerning however she has similar finding in October. She has had ultrasounds in the past which were negative. Patient denies pelvic symptoms and refuses a pelvic exam. Her urine is negative for pregnancy.  I had a lengthy discussion with the patient regarding her chronic abdominal pain and her lab results today and her workup in the past. I do not suspect serious bacterial illness, surgical abdomen, urinary tract infection, pyelonephritis, significant pelvic disorder. Her symptoms are chronic in nature and at this point I do not feel further imaging is necessary. I  recommended a pelvic exam which patient declined. At this point all discharge patient and she should continue with her outpatient medications at this time. I will  not provide patient with any narcotics at this time. She should followup with her PCM with in 3 days.  ER precautions were given.  Pt agreed with plan.      Darlys Gales, MD 07/07/13 [phone]

## 2013-07-10 NOTE — ED Provider Notes (Signed)
07-01-2013  Medical screening examination/treatment/procedure(s) were performed by non-physician practitioner and as supervising physician I was immediately available for consultation/collaboration.  EKG Interpretation   None         Roney Marion, MD 07/10/13 [phone]

## 2013-11-02 ENCOUNTER — Inpatient Hospital Stay (HOSPITAL_COMMUNITY)
Admission: AD | Admit: 2013-11-02 | Discharge: 2013-11-02 | Disposition: A | Discharge status: Home / Self Care | Payer: Self-pay | Source: Ambulatory Visit | Attending: Obstetrics & Gynecology | Admitting: Obstetrics & Gynecology

## 2013-11-02 DIAGNOSIS — F17200 Nicotine dependence, unspecified, uncomplicated: Secondary | ICD-10-CM | POA: Insufficient documentation

## 2013-11-02 DIAGNOSIS — N920 Excessive and frequent menstruation with regular cycle: Secondary | ICD-10-CM

## 2013-11-02 LAB — URINALYSIS, ROUTINE W REFLEX MICROSCOPIC
BILIRUBIN URINE: NEGATIVE
GLUCOSE, UA: NEGATIVE mg/dL
KETONES UR: NEGATIVE mg/dL
LEUKOCYTES UA: NEGATIVE
NITRITE: NEGATIVE
Protein, ur: NEGATIVE mg/dL
Specific Gravity, Urine: 1.01 (ref 1.005–1.030)
Urobilinogen, UA: 0.2 mg/dL (ref 0.0–1.0)
pH: 5.5 (ref 5.0–8.0)

## 2013-11-02 LAB — CBC
HCT: 40.3 % (ref 36.0–46.0)
HEMOGLOBIN: 14.2 g/dL (ref 12.0–15.0)
MCH: 34.2 pg — AB (ref 26.0–34.0)
MCHC: 35.2 g/dL (ref 30.0–36.0)
MCV: 97.1 fL (ref 78.0–100.0)
PLATELETS: 209 10*3/uL (ref 150–400)
RBC: 4.15 MIL/uL (ref 3.87–5.11)
RDW: 12.4 % (ref 11.5–15.5)
WBC: 9.5 10*3/uL (ref 4.0–10.5)

## 2013-11-02 LAB — URINE MICROSCOPIC-ADD ON

## 2013-11-02 LAB — HCG, QUANTITATIVE, PREGNANCY: hCG, Beta Chain, Quant, S: 1 m[IU]/mL (ref <5)

## 2013-11-02 LAB — ABO/RH: ABO/RH(D): O POS

## 2013-11-02 LAB — POCT PREGNANCY, URINE: Preg Test, Ur: NEGATIVE

## 2013-11-02 NOTE — MAU Provider Note (Signed)
Attestation of Attending Supervision of Advanced Practitioner (CNM/NP): Evaluation and management procedures were performed by the Advanced Practitioner under my supervision and collaboration.  I have reviewed the Advanced Practitioner's note and chart, and I agree with the management and plan.  HARRAWAY-SMITH, Joua Bake 7:47 PM     

## 2013-11-02 NOTE — Discharge Instructions (Signed)

## 2013-11-02 NOTE — MAU Provider Note (Signed)
History     CSN: [id number]  Arrival date and time: 11/02/13 1608   None     Chief Complaint  Patient presents with  . Possible Pregnancy  . Vaginal Bleeding  . Abdominal Cramping   HPI  Joyce Klein is a 28 y.o. [id number] who presetns today with vaginal bleeding. She states that she started her period on 10/14/13 and has been bleeding since. She states that menarche was at age 67, and she has had some periods that were different, but for the most part they have been normal. She is not on any contraceptives at this time. She is concerned that she may be pregnant, and having a miscarriage.   Past Medical History  Diagnosis Date  . Depression   . Headache(784.0)   . Shortness of breath   . Blood transfusion without reported diagnosis 2005    PPH  . Bipolar 1 disorder   . Schizophrenia     Past Surgical History  Procedure Laterality Date  . Tubal ligation    . Tubal ligation      Family History  Problem Relation Age of Onset  . Other Neg Hx   . Irritable bowel syndrome Mother     History  Substance Use Topics  . Smoking status: Current Every Day Smoker -- 0.25 packs/day for 14 years    Types: Cigarettes  . Smokeless tobacco: Never Used  . Alcohol Use: Yes     Comment: occasioanal    Allergies:  Allergies  Allergen Reactions  . Bee Venom Anaphylaxis  . Coconut Oil Anaphylaxis  . Other Anaphylaxis    Pecans    Prescriptions prior to admission  Medication Sig Dispense Refill  . albuterol (PROVENTIL HFA;VENTOLIN HFA) 108 (90 BASE) MCG/ACT inhaler Inhale 2 puffs into the lungs every 6 (six) hours as needed. For shortness of breath      . doxycycline (VIBRAMYCIN) 100 MG capsule Take 100 mg by mouth 2 (two) times daily.      . methocarbamol (ROBAXIN) 500 MG tablet Take 1 tablet (500 mg total) by mouth 2 (two) times daily.  20 tablet  0  . Multiple Vitamin (MULTIVITAMIN WITH MINERALS) TABS tablet Take 1 tablet by mouth daily.      Marland Kitchen oxyCODONE-acetaminophen  (PERCOCET/ROXICET) 5-325 MG per tablet Take 1 tablet by mouth every 8 (eight) hours as needed for pain.  11 tablet  0  . promethazine (PHENERGAN) 25 MG tablet Take 25 mg by mouth every 6 (six) hours as needed for nausea.        ROS Physical Exam   Blood pressure 114/76, pulse 91, temperature 98.4 F (36.9 C), temperature source Oral, resp. rate 16, height 5\' 3"  (1.6 m), weight 64.774 kg (142 lb 12.8 oz), last menstrual period 10/14/2013, SpO2 100.00%.  Physical Exam  Nursing note and vitals reviewed. Constitutional: She is oriented to person, place, and time. She appears well-developed and well-nourished. No distress.  Cardiovascular: Normal rate.   Respiratory: Effort normal.  Neurological: She is alert and oriented to person, place, and time.  Skin: Skin is warm and dry.  Psychiatric: She has a normal mood and affect.    MAU Course  Procedures  Results for orders placed during the hospital encounter of 11/02/13 (from the past 24 hour(s))  URINALYSIS, ROUTINE W REFLEX MICROSCOPIC     Status: Abnormal   Collection Time    11/02/13  4:16 PM      Result Value Ref Range   Color, Urine YELLOW  YELLOW   APPearance CLEAR  CLEAR   Specific Gravity, Urine 1.010  1.005 - 1.030   pH 5.5  5.0 - 8.0   Glucose, UA NEGATIVE  NEGATIVE mg/dL   Hgb urine dipstick MODERATE (*) NEGATIVE   Bilirubin Urine NEGATIVE  NEGATIVE   Ketones, ur NEGATIVE  NEGATIVE mg/dL   Protein, ur NEGATIVE  NEGATIVE mg/dL   Urobilinogen, UA 0.2  0.0 - 1.0 mg/dL   Nitrite NEGATIVE  NEGATIVE   Leukocytes, UA NEGATIVE  NEGATIVE  URINE MICROSCOPIC-ADD ON     Status: Abnormal   Collection Time    11/02/13  4:16 PM      Result Value Ref Range   Squamous Epithelial / LPF FEW (*) RARE   WBC, UA 0-2  <3 WBC/hpf   RBC / HPF 0-2  <3 RBC/hpf   Bacteria, UA RARE  RARE  POCT PREGNANCY, URINE     Status: None   Collection Time    11/02/13  4:54 PM      Result Value Ref Range   Preg Test, Ur NEGATIVE  NEGATIVE  HCG,  QUANTITATIVE, PREGNANCY     Status: None   Collection Time    11/02/13  5:29 PM      Result Value Ref Range   hCG, Beta Chain, Quant, S 1  <5 mIU/mL  CBC     Status: Abnormal   Collection Time    11/02/13  5:30 PM      Result Value Ref Range   WBC 9.5  4.0 - 10.5 K/uL   RBC 4.15  3.87 - 5.11 MIL/uL   Hemoglobin 14.2  12.0 - 15.0 g/dL   HCT 16.140.3  09.636.0 - 04.546.0 %   MCV 97.1  78.0 - 100.0 fL   MCH 34.2 (*) 26.0 - 34.0 pg   MCHC 35.2  30.0 - 36.0 g/dL   RDW 40.912.4  81.111.5 - 91.415.5 %   Platelets 209  150 - 400 K/uL  ABO/RH     Status: None   Collection Time    11/02/13  5:30 PM      Result Value Ref Range   ABO/RH(D) O POS       Assessment and Plan   1. Menorrhagia    Bleeding precautions Return to MAU as needed  Follow-up Information   [facility] (Wednesday at 1:00 )    Specialty:  Obstetrics and Gynecology   Contact information:   [address] [place] [address] [phone]       Tawnya CrookHogan, Heather Donovan 11/02/2013, 6:44 PM

## 2013-11-02 NOTE — MAU Note (Signed)
Patient states she started her period on 2-15 and has had bleeding since that time.Heavy to normal flow. Has had abdominal cramping. Gets lightheaded and dizzy when standing up and walking for a long time. States she has had one positive home pregnancy test and one negative.

## 2013-11-07 ENCOUNTER — Ambulatory Visit (INDEPENDENT_AMBULATORY_CARE_PROVIDER_SITE_OTHER): Payer: Self-pay | Admitting: Obstetrics and Gynecology

## 2013-11-07 ENCOUNTER — Encounter: Payer: Self-pay | Admitting: Obstetrics and Gynecology

## 2013-11-07 ENCOUNTER — Telehealth: Payer: Self-pay

## 2013-11-07 VITALS — BP 111/70 | HR 84 | Temp 97.3°F | Ht 63.0 in | Wt 140.8 lb

## 2013-11-07 DIAGNOSIS — N920 Excessive and frequent menstruation with regular cycle: Secondary | ICD-10-CM

## 2013-11-07 NOTE — Progress Notes (Signed)
Patient reports during prolonged period increased appetite, weight gain, nausea, moodiness and irritability. Patient states she got her tubes tied 5 years ago, and was told it only lasted 5 years then she could get pregnant again, this year makes year 5

## 2013-11-07 NOTE — Telephone Encounter (Signed)
Pt. Expressed to Dr. Jolayne Pantheronstant today at appointment that she wanted a referral for anxiety. Called pt. And [facility] [phone] and informed her that this would be a good place to start but that once she obtains insurance she could have her anxiety managed by family practice or another PCP of her choosing. Pt. Verbalized understanding and gratitude and had no further questions or concerns.

## 2013-11-07 NOTE — Progress Notes (Signed)
Patient ID: Joyce CancerJessica D Klein, female   DOB: 10/03/1985, 28 y.o.   MRN: [id number] 28 yo P2 presenting today as an MAU follow up for a one month history of abnormal uterine bleeding. Patient reports onset of period on 2/15 and she bled persistently until 3/9. Patient was seen in MAU with a normal hemoglobin. She has been asymptomatic the entire time. Her normal cycles occur monthly and last 4 days. Patient is currently doing well and is without complaints. She is sexually active using BTL for contraception.   Patient declined exam today  A/P 28 yo with a 1 month h/o menorrhagia - Advised to keep track of menstrual cycles - If menorrhagia returns may need to evaluate with pelvic ultrasound to rule out anatomical defects  - RTC prn - Patient desires to defer testing until insurance valid. She also requested referral to psychiatry or PCP for anxiety management

## 2014-06-26 ENCOUNTER — Emergency Department (HOSPITAL_COMMUNITY): Payer: Self-pay

## 2014-06-26 ENCOUNTER — Emergency Department (HOSPITAL_COMMUNITY)
Admission: EM | Admit: 2014-06-26 | Discharge: 2014-06-27 | Disposition: A | Discharge status: Home / Self Care | Payer: Self-pay | Attending: Emergency Medicine | Admitting: Emergency Medicine

## 2014-06-26 ENCOUNTER — Encounter (HOSPITAL_COMMUNITY): Payer: Self-pay | Admitting: Emergency Medicine

## 2014-06-26 DIAGNOSIS — Z9851 Tubal ligation status: Secondary | ICD-10-CM | POA: Insufficient documentation

## 2014-06-26 DIAGNOSIS — J45909 Unspecified asthma, uncomplicated: Secondary | ICD-10-CM | POA: Insufficient documentation

## 2014-06-26 DIAGNOSIS — Z79899 Other long term (current) drug therapy: Secondary | ICD-10-CM | POA: Insufficient documentation

## 2014-06-26 DIAGNOSIS — Z720 Tobacco use: Secondary | ICD-10-CM | POA: Insufficient documentation

## 2014-06-26 DIAGNOSIS — R1110 Vomiting, unspecified: Secondary | ICD-10-CM

## 2014-06-26 DIAGNOSIS — R112 Nausea with vomiting, unspecified: Secondary | ICD-10-CM | POA: Insufficient documentation

## 2014-06-26 DIAGNOSIS — R109 Unspecified abdominal pain: Secondary | ICD-10-CM

## 2014-06-26 DIAGNOSIS — Z3202 Encounter for pregnancy test, result negative: Secondary | ICD-10-CM | POA: Insufficient documentation

## 2014-06-26 DIAGNOSIS — R1011 Right upper quadrant pain: Secondary | ICD-10-CM | POA: Insufficient documentation

## 2014-06-26 DIAGNOSIS — F319 Bipolar disorder, unspecified: Secondary | ICD-10-CM | POA: Insufficient documentation

## 2014-06-26 DIAGNOSIS — R197 Diarrhea, unspecified: Secondary | ICD-10-CM | POA: Insufficient documentation

## 2014-06-26 DIAGNOSIS — G43909 Migraine, unspecified, not intractable, without status migrainosus: Secondary | ICD-10-CM | POA: Insufficient documentation

## 2014-06-26 HISTORY — DX: Unspecified asthma, uncomplicated: J45.909

## 2014-06-26 HISTORY — DX: Migraine, unspecified, not intractable, without status migrainosus: G43.909

## 2014-06-26 LAB — URINALYSIS, ROUTINE W REFLEX MICROSCOPIC
Bilirubin Urine: NEGATIVE
Glucose, UA: NEGATIVE mg/dL
Hgb urine dipstick: NEGATIVE
KETONES UR: NEGATIVE mg/dL
NITRITE: NEGATIVE
PH: 7 (ref 5.0–8.0)
Protein, ur: NEGATIVE mg/dL
Specific Gravity, Urine: 1.009 (ref 1.005–1.030)
Urobilinogen, UA: 0.2 mg/dL (ref 0.0–1.0)

## 2014-06-26 LAB — URINE MICROSCOPIC-ADD ON

## 2014-06-26 LAB — LIPASE, BLOOD: Lipase: 41 U/L (ref 11–59)

## 2014-06-26 LAB — CBC WITH DIFFERENTIAL/PLATELET
BASOS ABS: 0 10*3/uL (ref 0.0–0.1)
BASOS PCT: 0 % (ref 0–1)
EOS ABS: 0.4 10*3/uL (ref 0.0–0.7)
EOS PCT: 4 % (ref 0–5)
HCT: 39.6 % (ref 36.0–46.0)
Hemoglobin: 13.4 g/dL (ref 12.0–15.0)
Lymphocytes Relative: 28 % (ref 12–46)
Lymphs Abs: 2.6 10*3/uL (ref 0.7–4.0)
MCH: 33.3 pg (ref 26.0–34.0)
MCHC: 33.8 g/dL (ref 30.0–36.0)
MCV: 98.3 fL (ref 78.0–100.0)
Monocytes Absolute: 0.6 10*3/uL (ref 0.1–1.0)
Monocytes Relative: 7 % (ref 3–12)
NEUTROS PCT: 61 % (ref 43–77)
Neutro Abs: 5.8 10*3/uL (ref 1.7–7.7)
PLATELETS: 197 10*3/uL (ref 150–400)
RBC: 4.03 MIL/uL (ref 3.87–5.11)
RDW: 12.8 % (ref 11.5–15.5)
WBC: 9.4 10*3/uL (ref 4.0–10.5)

## 2014-06-26 LAB — COMPREHENSIVE METABOLIC PANEL
ALBUMIN: 3.9 g/dL (ref 3.5–5.2)
ALK PHOS: 60 U/L (ref 39–117)
ALT: 12 U/L (ref 0–35)
AST: 17 U/L (ref 0–37)
Anion gap: 12 (ref 5–15)
BUN: 11 mg/dL (ref 6–23)
CO2: 24 mEq/L (ref 19–32)
Calcium: 8.8 mg/dL (ref 8.4–10.5)
Chloride: 105 mEq/L (ref 96–112)
Creatinine, Ser: 0.73 mg/dL (ref 0.50–1.10)
GFR calc Af Amer: >90 mL/min (ref >90)
GFR calc non Af Amer: >90 mL/min (ref >90)
Glucose, Bld: 111 mg/dL — ABNORMAL HIGH (ref 70–99)
POTASSIUM: 3.5 meq/L — AB (ref 3.7–5.3)
SODIUM: 141 meq/L (ref 137–147)
TOTAL PROTEIN: 7.3 g/dL (ref 6.0–8.3)
Total Bilirubin: <0.2 mg/dL — ABNORMAL LOW (ref 0.3–1.2)

## 2014-06-26 LAB — PREGNANCY, URINE: Preg Test, Ur: NEGATIVE

## 2014-06-26 MED ORDER — ONDANSETRON HCL 4 MG/2ML IJ SOLN
4.0000 mg | Freq: Once | INTRAMUSCULAR | Status: AC
  Administered 2014-06-26: 4 mg via INTRAVENOUS
  Filled 2014-06-26: qty 2

## 2014-06-26 MED ORDER — SODIUM CHLORIDE 0.9 % IV BOLUS (SEPSIS)
1000.0000 mL | Freq: Once | INTRAVENOUS | Status: AC
  Administered 2014-06-26: 1000 mL via INTRAVENOUS

## 2014-06-26 MED ORDER — MORPHINE SULFATE 4 MG/ML IJ SOLN
4.0000 mg | Freq: Once | INTRAMUSCULAR | Status: AC
  Administered 2014-06-26: 4 mg via INTRAVENOUS
  Filled 2014-06-26: qty 1

## 2014-06-26 NOTE — ED Notes (Signed)
Pt. reports right lateral/lower abdominal pain with nausea and diarrhea onset this morning , denies fever or chills.

## 2014-06-26 NOTE — ED Provider Notes (Signed)
CSN: [id number]     Arrival date & time 06/26/14  1943 History   First MD Initiated Contact with Patient 06/26/14 2255     Chief Complaint  Patient presents with  . Abdominal Pain     (Consider location/radiation/quality/duration/timing/severity/associated sxs/prior Treatment) HPI  This is a 28 year old female who presents with a one-day history of abdominal pain. Patient reports right upper quadrant and right flank pain. She reports nausea and several days of diarrhea. No blood noted in her stool. Patient denies any fevers. Patient states that the pain is constant with waxing and waning intensity. Currently it is 8 out of 10. She took Excedrin prior to arrival which did not help. Patient has a history of gallstones and feels this is similar.  Denies any dysuria or hematuria. No history of kidney stones.    Past Medical History  Diagnosis Date  . Depression   . Headache(784.0)   . Shortness of breath   . Blood transfusion without reported diagnosis 2005    PPH  . Bipolar 1 disorder   . Schizophrenia   . Asthma   . Migraine    Past Surgical History  Procedure Laterality Date  . Tubal ligation    . Tubal ligation     Family History  Problem Relation Age of Onset  . Other Neg Hx   . Irritable bowel syndrome Mother    History  Substance Use Topics  . Smoking status: Current Every Day Smoker -- 0.25 packs/day for 14 years    Types: Cigarettes  . Smokeless tobacco: Never Used  . Alcohol Use: Yes     Comment: occasioanal   OB History   Grav Para Term Preterm Abortions TAB SAB Ect Mult Living   14 2 2  12  12   2      Review of Systems  Constitutional: Negative for fever.  Respiratory: Negative for cough, chest tightness and shortness of breath.   Cardiovascular: Negative for chest pain.  Gastrointestinal: Positive for nausea, abdominal pain and diarrhea. Negative for vomiting and constipation.  Genitourinary: Positive for flank pain. Negative for dysuria and hematuria.   Musculoskeletal: Negative for back pain.  Skin: Negative for wound.  Neurological: Negative for headaches.  Psychiatric/Behavioral: Negative for confusion.  All other systems reviewed and are negative.     Allergies  Bee venom; Coconut oil; and Other  Home Medications   Prior to Admission medications   Medication Sig Start Date End Date Taking? Authorizing Provider  albuterol (PROVENTIL HFA;VENTOLIN HFA) 108 (90 BASE) MCG/ACT inhaler Inhale 2 puffs into the lungs every 6 (six) hours as needed. For shortness of breath   Yes Historical Provider, MD  aspirin-acetaminophen-caffeine (EXCEDRIN MIGRAINE) [phone] MG per tablet Take 1 tablet by mouth every 6 (six) hours as needed for headache.    Yes Historical Provider, MD  citalopram (CELEXA) 20 MG tablet Take 20 mg by mouth 2 (two) times daily.   Yes Historical Provider, MD  topiramate (TOPAMAX) 25 MG tablet Take 25 mg by mouth daily as needed. For headache per patient   Yes Historical Provider, MD  valACYclovir (VALTREX) 500 MG tablet Take 500 mg by mouth daily as needed.   Yes Historical Provider, MD  HYDROcodone-acetaminophen (NORCO/VICODIN) 5-325 MG per tablet Take 1 tablet by mouth every 6 (six) hours as needed for moderate pain or severe pain. 06/27/14   Shon Batonourtney F Horton, MD  loperamide (IMODIUM) 2 MG capsule Take 1 capsule (2 mg total) by mouth 4 (four) times daily as  needed for diarrhea or loose stools. 06/27/14   Shon Batonourtney F Horton, MD  ondansetron (ZOFRAN) 4 MG tablet Take 1 tablet (4 mg total) by mouth every 8 (eight) hours as needed for nausea or vomiting. 06/27/14   Shon Batonourtney F Horton, MD   BP 112/75  Pulse 65  Temp(Src) 98 F (36.7 C) (Oral)  Resp 17  SpO2 99%  LMP 06/22/2014 Physical Exam  Nursing note and vitals reviewed. Constitutional: She is oriented to person, place, and time. She appears well-developed and well-nourished.  HENT:  Head: Normocephalic and atraumatic.  Poor dentition  Cardiovascular: Normal rate,  regular rhythm and normal heart sounds.   Pulmonary/Chest: Effort normal and breath sounds normal. No respiratory distress. She has no wheezes.  Abdominal: Soft. Bowel sounds are normal. There is tenderness. There is no rebound and no guarding.  Right upper quadrant tenderness to palpation, positive Murphy sign  Neurological: She is alert and oriented to person, place, and time.  Skin: Skin is warm and dry.  Psychiatric: She has a normal mood and affect.    ED Course  Procedures (including critical care time) Labs Review Labs Reviewed  COMPREHENSIVE METABOLIC PANEL - Abnormal; Notable for the following:    Potassium 3.5 (*)    Glucose, Bld 111 (*)    Total Bilirubin <0.2 (*)    All other components within normal limits  URINALYSIS, ROUTINE W REFLEX MICROSCOPIC - Abnormal; Notable for the following:    Leukocytes, UA SMALL (*)    All other components within normal limits  CBC WITH DIFFERENTIAL  LIPASE, BLOOD  PREGNANCY, URINE  URINE MICROSCOPIC-ADD ON    Imaging Review [place] Abdomen Limited Ruq  06/27/2014   CLINICAL DATA:  Right upper quadrant abdominal pain.  EXAM: US ABDOMEN LIMITED - RIGHT UPPER QUADRANT  COMPARISON:  None.  FINDINGS: Gallbladder:  No gallstones or wall thickening visualized. No sonographic Murphy sign noted.  Common bile duct:  Diameter: 4 mm  Liver:  No focal lesion identified. Within normal limits in parenchymal echogenicity.  IMPRESSION: Negative right upper quadrant ultrasound.   Electronically Signed   By: Tiburcio PeaJonathan  Watts M.D.   On: 06/27/2014 00:33     EKG Interpretation None      MDM   Final diagnoses:  RUQ pain  Abdominal pain, vomiting, and diarrhea   Patient presents with nausea, diarrhea and abdominal pain. She is nontoxic on exam. Tenderness palpation of the right upper quadrant. Reports history of gallstones. Lab work is largely reassuring including normal LFTs. Patient given pain medication and fluids. Right upper quadrant ultrasound  obtained to evaluate for cholecystitis. Right partial ultrasound normal and without evidence of gallstones as well. Given patient's vomiting and diarrhea, abdominal cramping could be secondary to acute viral gastroenteritis. She is well appearing and able to tolerate fluids. Discussed with patient supportive care at home including Zofran and Imodium.  Patient follow-up with PCP in 2 days.  After history, exam, and medical workup I feel the patient has been appropriately medically screened and is safe for discharge home. Pertinent diagnoses were discussed with the patient. Patient was given return precautions.    Shon Batonourtney F Horton, MD 06/27/14 [phone]

## 2014-06-27 MED ORDER — ONDANSETRON HCL 4 MG PO TABS: 4.0000 mg | ORAL_TABLET | Freq: Three times a day (TID) | ORAL | Status: DC | PRN

## 2014-06-27 MED ORDER — MORPHINE SULFATE 4 MG/ML IJ SOLN
4.0000 mg | Freq: Once | INTRAMUSCULAR | Status: AC
  Administered 2014-06-27: 4 mg via INTRAVENOUS
  Filled 2014-06-27: qty 1

## 2014-06-27 MED ORDER — LOPERAMIDE HCL 2 MG PO CAPS: 2.0000 mg | ORAL_CAPSULE | Freq: Four times a day (QID) | ORAL | Status: DC | PRN

## 2014-06-27 MED ORDER — HYDROCODONE-ACETAMINOPHEN 5-325 MG PO TABS: 1.0000 | ORAL_TABLET | Freq: Four times a day (QID) | ORAL | Status: DC | PRN

## 2014-06-27 NOTE — Discharge Instructions (Signed)

## 2014-06-27 NOTE — ED Notes (Signed)
Pt given soda for po challenge.

## 2014-06-27 NOTE — ED Notes (Signed)
Pt verbalizes understanding of d/c instructions and directions for follow-up care. Pt verbalizes no additional questions or concerns. 

## 2014-07-01 ENCOUNTER — Encounter (HOSPITAL_COMMUNITY): Payer: Self-pay | Admitting: Emergency Medicine

## 2014-11-01 ENCOUNTER — Emergency Department (HOSPITAL_COMMUNITY)
Admission: EM | Admit: 2014-11-01 | Discharge: 2014-11-01 | Discharge status: Against Medical Advice | Payer: Self-pay | Attending: Emergency Medicine | Admitting: Emergency Medicine

## 2014-11-01 ENCOUNTER — Encounter (HOSPITAL_COMMUNITY): Payer: Self-pay

## 2014-11-01 DIAGNOSIS — J45909 Unspecified asthma, uncomplicated: Secondary | ICD-10-CM | POA: Insufficient documentation

## 2014-11-01 DIAGNOSIS — Z720 Tobacco use: Secondary | ICD-10-CM | POA: Insufficient documentation

## 2014-11-01 DIAGNOSIS — G43909 Migraine, unspecified, not intractable, without status migrainosus: Secondary | ICD-10-CM | POA: Insufficient documentation

## 2014-11-01 NOTE — ED Notes (Addendum)
Pt reports she can stay and she is leaving lobby. Pt alert, oriented, and ambulatory upon leaving
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
## 2015-03-04 ENCOUNTER — Encounter (HOSPITAL_COMMUNITY): Payer: Self-pay | Admitting: Emergency Medicine

## 2015-03-04 ENCOUNTER — Emergency Department (HOSPITAL_COMMUNITY)
Admission: EM | Admit: 2015-03-04 | Discharge: 2015-03-05 | Disposition: A | Discharge status: Home / Self Care | Payer: Self-pay | Attending: Emergency Medicine | Admitting: Emergency Medicine

## 2015-03-04 DIAGNOSIS — Z720 Tobacco use: Secondary | ICD-10-CM | POA: Insufficient documentation

## 2015-03-04 DIAGNOSIS — F419 Anxiety disorder, unspecified: Secondary | ICD-10-CM | POA: Insufficient documentation

## 2015-03-04 DIAGNOSIS — Z79899 Other long term (current) drug therapy: Secondary | ICD-10-CM | POA: Insufficient documentation

## 2015-03-04 DIAGNOSIS — Z7982 Long term (current) use of aspirin: Secondary | ICD-10-CM | POA: Insufficient documentation

## 2015-03-04 DIAGNOSIS — F319 Bipolar disorder, unspecified: Secondary | ICD-10-CM | POA: Insufficient documentation

## 2015-03-04 DIAGNOSIS — G43809 Other migraine, not intractable, without status migrainosus: Secondary | ICD-10-CM | POA: Insufficient documentation

## 2015-03-04 DIAGNOSIS — R112 Nausea with vomiting, unspecified: Secondary | ICD-10-CM | POA: Insufficient documentation

## 2015-03-04 DIAGNOSIS — J45909 Unspecified asthma, uncomplicated: Secondary | ICD-10-CM | POA: Insufficient documentation

## 2015-03-04 DIAGNOSIS — Z3202 Encounter for pregnancy test, result negative: Secondary | ICD-10-CM | POA: Insufficient documentation

## 2015-03-04 LAB — URINALYSIS, ROUTINE W REFLEX MICROSCOPIC
Bilirubin Urine: NEGATIVE
Glucose, UA: NEGATIVE mg/dL
Hgb urine dipstick: NEGATIVE
Ketones, ur: NEGATIVE mg/dL
Leukocytes, UA: NEGATIVE
NITRITE: NEGATIVE
Protein, ur: NEGATIVE mg/dL
SPECIFIC GRAVITY, URINE: 1.016 (ref 1.005–1.030)
UROBILINOGEN UA: 0.2 mg/dL (ref 0.0–1.0)
pH: 6.5 (ref 5.0–8.0)

## 2015-03-04 LAB — CBC
HCT: 40.2 % (ref 36.0–46.0)
Hemoglobin: 13.3 g/dL (ref 12.0–15.0)
MCH: 32.8 pg (ref 26.0–34.0)
MCHC: 33.1 g/dL (ref 30.0–36.0)
MCV: 99.3 fL (ref 78.0–100.0)
Platelets: 219 10*3/uL (ref 150–400)
RBC: 4.05 MIL/uL (ref 3.87–5.11)
RDW: 12.7 % (ref 11.5–15.5)
WBC: 8.4 10*3/uL (ref 4.0–10.5)

## 2015-03-04 LAB — POC URINE PREG, ED: Preg Test, Ur: NEGATIVE

## 2015-03-04 LAB — BASIC METABOLIC PANEL
Anion gap: 5 (ref 5–15)
BUN: 5 mg/dL — ABNORMAL LOW (ref 6–20)
CALCIUM: 8.4 mg/dL — AB (ref 8.9–10.3)
CO2: 28 mmol/L (ref 22–32)
Chloride: 102 mmol/L (ref 101–111)
Creatinine, Ser: 0.76 mg/dL (ref 0.44–1.00)
GFR calc Af Amer: >60 mL/min (ref >60)
GLUCOSE: 109 mg/dL — AB (ref 65–99)
POTASSIUM: 3.8 mmol/L (ref 3.5–5.1)
Sodium: 135 mmol/L (ref 135–145)

## 2015-03-04 MED ORDER — SODIUM CHLORIDE 0.9 % IV BOLUS (SEPSIS)
1000.0000 mL | Freq: Once | INTRAVENOUS | Status: AC
  Administered 2015-03-04: 1000 mL via INTRAVENOUS

## 2015-03-04 MED ORDER — DIPHENHYDRAMINE HCL 50 MG/ML IJ SOLN
25.0000 mg | Freq: Once | INTRAMUSCULAR | Status: AC
  Administered 2015-03-04: 25 mg via INTRAVENOUS
  Filled 2015-03-04: qty 1

## 2015-03-04 MED ORDER — DEXAMETHASONE SODIUM PHOSPHATE 10 MG/ML IJ SOLN
10.0000 mg | Freq: Once | INTRAMUSCULAR | Status: AC
  Administered 2015-03-04: 10 mg via INTRAVENOUS
  Filled 2015-03-04: qty 1

## 2015-03-04 MED ORDER — KETOROLAC TROMETHAMINE 30 MG/ML IJ SOLN
30.0000 mg | Freq: Once | INTRAMUSCULAR | Status: AC
  Administered 2015-03-04: 30 mg via INTRAVENOUS
  Filled 2015-03-04: qty 1

## 2015-03-04 MED ORDER — METOCLOPRAMIDE HCL 5 MG/ML IJ SOLN
10.0000 mg | Freq: Once | INTRAMUSCULAR | Status: AC
  Administered 2015-03-04: 10 mg via INTRAVENOUS
  Filled 2015-03-04: qty 2

## 2015-03-04 NOTE — ED Provider Notes (Signed)
CSN: [id number]     Arrival date & time 03/04/15  2033 History   First MD Initiated Contact with Patient 03/04/15 2119     Chief Complaint  Patient presents with  . Dizziness  . Nausea     (Consider location/radiation/quality/duration/timing/severity/associated sxs/prior Treatment) The history is provided by the patient and medical records. No language interpreter was used.     Joyce Klein is a 29 y.o. female  with a hx of depression, anxiety, migraine headache, bipolar 1 disorder, schizophrenia, asthma presents to the Emergency Department complaining of gradual, persistent, progressively worsening migraine headache with associated nausea and intermittent vomiting onset 1 month ago.  Pt reports worsening in the last 2 weeks.  She reports an increase in panic attacks which exacerbates these symptoms.  She reports panic attacks at least 2x per week.  Pt reports taking Excedrin migraine which almost always resolves the headache but this headache has lasted since 4pm.  She reports daily migraine headaches for the last month.  She reports chest pain x 3 days but occuring only with her panic attacks.  She reports when this happens she has sharp chest pains that always resolve with her panic attack.  She is chest pain free at this time.  Pt denies fever, chills, neck pain, neck stiffness, SOB, abd pain, weakness, syncope, dysuria.  LMP: 02/28/15  PCP - none  Past Medical History  Diagnosis Date  . Depression   . Headache(784.0)   . Shortness of breath   . Blood transfusion without reported diagnosis 2005    PPH  . Bipolar 1 disorder   . Schizophrenia   . Asthma   . Migraine    Past Surgical History  Procedure Laterality Date  . Tubal ligation    . Tubal ligation     Family History  Problem Relation Age of Onset  . Other Neg Hx   . Irritable bowel syndrome Mother    History  Substance Use Topics  . Smoking status: Current Every Day Smoker -- 0.50 packs/day for 14 years    Types:  Cigarettes  . Smokeless tobacco: Never Used  . Alcohol Use: Yes     Comment: occasioanal   OB History    Gravida Para Term Preterm AB TAB SAB Ectopic Multiple Living   14 2 2  12  12   2      Review of Systems  Constitutional: Negative for fever, diaphoresis, appetite change, fatigue and unexpected weight change.  HENT: Negative for mouth sores.   Eyes: Negative for visual disturbance.  Respiratory: Negative for cough, chest tightness, shortness of breath and wheezing.   Cardiovascular: Negative for chest pain.  Gastrointestinal: Positive for nausea and vomiting. Negative for abdominal pain, diarrhea and constipation.  Endocrine: Negative for polydipsia, polyphagia and polyuria.  Genitourinary: Negative for dysuria, urgency, frequency and hematuria.  Musculoskeletal: Negative for back pain and neck stiffness.  Skin: Negative for rash.  Allergic/Immunologic: Negative for immunocompromised state.  Neurological: Positive for dizziness, light-headedness and headaches. Negative for syncope.  Hematological: Does not bruise/bleed easily.  Psychiatric/Behavioral: Negative for sleep disturbance. The patient is nervous/anxious.       Allergies  Bee venom; Coconut oil; and Other  Home Medications   Prior to Admission medications   Medication Sig Start Date End Date Taking? Authorizing Provider  albuterol (PROVENTIL HFA;VENTOLIN HFA) 108 (90 BASE) MCG/ACT inhaler Inhale 2 puffs into the lungs every 6 (six) hours as needed. For shortness of breath    Historical Provider, MD  aspirin-acetaminophen-caffeine (EXCEDRIN MIGRAINE) 250-250-65 MG per tablet Take 1 tablet by mouth every 6 (six) hours as needed for headache.     Historical Provider, MD  citalopram (CELEXA) 20 MG tablet Take 20 mg by mouth 2 (two) times daily.    Historical Provider, MD  topiramate (TOPAMAX) 25 MG tablet Take 25 mg by mouth daily as needed. For headache per patient    Historical Provider, MD  valACYclovir (VALTREX)  500 MG tablet Take 500 mg by mouth daily as needed.    Historical Provider, MD   BP 97/67 mmHg  Pulse 60  Temp(Src) 97.7 F (36.5 C) (Oral)  Resp 16  Ht  (1.6 m)  Wt 130 lb (58.968 kg)  BMI 23.03 kg/m2  SpO2 96%  LMP 02/28/2015 Physical Exam  Constitutional: She is oriented to person, place, and time. She appears well-developed and well-nourished. No distress.  HENT:  Head: Normocephalic and atraumatic.  Mouth/Throat: Oropharynx is clear and moist.  Eyes: Conjunctivae and EOM are normal. Pupils are equal, round, and reactive to light. No scleral icterus.  No horizontal, vertical or rotational nystagmus  Neck: Normal range of motion. Neck supple.  Full active and passive ROM without pain No midline or paraspinal tenderness No nuchal rigidity or meningeal signs  Cardiovascular: Normal rate, regular rhythm, normal heart sounds and intact distal pulses.   Pulmonary/Chest: Effort normal and breath sounds normal. No respiratory distress. She has no wheezes. She has no rales.  Abdominal: Soft. Bowel sounds are normal. There is no tenderness. There is no rebound and no guarding.  Musculoskeletal: Normal range of motion.  Lymphadenopathy:    She has no cervical adenopathy.  Neurological: She is alert and oriented to person, place, and time. She has normal reflexes. No cranial nerve deficit. She exhibits normal muscle tone. Coordination normal.  Mental Status:  Alert, oriented, thought content appropriate. Speech fluent without evidence of aphasia. Able to follow 2 step commands without difficulty.  Cranial Nerves:  II:  Peripheral visual fields grossly normal, pupils equal, round, reactive to light III,IV, VI: ptosis not present, extra-ocular motions intact bilaterally  V,VII: smile symmetric, facial light touch sensation equal VIII: hearing grossly normal bilaterally  IX,X: gag reflex present  XI: bilateral shoulder shrug equal and strong XII: midline tongue extension  Motor:   5/5 in upper and lower extremities bilaterally including strong and equal grip strength and dorsiflexion/plantar flexion Sensory: Pinprick and light touch normal in all extremities.  Deep Tendon Reflexes: 2+ and symmetric  Cerebellar: normal finger-to-nose with bilateral upper extremities Gait: normal gait and balance CV: distal pulses palpable throughout   Skin: Skin is warm and dry. No rash noted. She is not diaphoretic.  Psychiatric: She has a normal mood and affect. Her behavior is normal. Judgment and thought content normal.  Nursing note and vitals reviewed.   ED Course  Procedures (including critical care time) Labs Review Labs Reviewed  BASIC METABOLIC PANEL - Abnormal; Notable for the following:    Glucose, Bld 109 (*)    BUN 5 (*)    Calcium 8.4 (*)    All other components within normal limits  URINALYSIS, ROUTINE W REFLEX MICROSCOPIC (NOT AT [facility]) - Abnormal; Notable for the following:    APPearance HAZY (*)    All other components within normal limits  CBC  POC URINE PREG, ED    Imaging Review No results found.   EKG Interpretation None        MDM   Final diagnoses:  Other migraine without status migrainosus, not intractable   Ruthann CancerJessica D Anker presents with complaints of persistent migraine headache with associated dizziness and intermittent nausea and vomiting. These occur daily and usually resolve with Excedrin Migraine. Today's headache did not resolve. Patient also with frequent anxiety attacks. She is well-appearing at this time within normal neurologic exam. We'll get migraine cocktail and check lab work.  She does report chest pain for the last 3 days however she reports this is only associated with her panic attacks and resolves immediately with her panic attack.  She has no cardiac hx and I doubt ACS with this hx and normal physical exam.  Patient is without orthostasis.  Orthostatic VS for the past 24 hrs:  BP- Lying Pulse- Lying BP- Sitting Pulse-  Sitting BP- Standing at 0 minutes Pulse- Standing at 0 minutes  03/04/15 2223 93/55 mmHg 65 90/64 mmHg 72 92/66 mmHg 82    11:38 PM Patient reports complete resolution of headache and other symptoms. Labs are reassuring. Evidence of urinary tract infection. Negative pregnancy test.  12:45 AM She reports she feels well. Her blood pressure has improved and she is ambulated here in the emergency department without difficulty and with steady gait. Will be discharged home with resources to find primary care physician for further evaluation of her headaches. Also recommended that she find a counselor using the resource guide to help assist with her anxiety symptoms.  BP 97/67 mmHg  Pulse 60  Temp(Src) 97.7 F (36.5 C) (Oral)  Resp 16  Ht 5\' 3"  (1.6 m)  Wt 130 lb (58.968 kg)  BMI 23.03 kg/m2  SpO2 96%  LMP 02/28/2015   Dahlia ClientHannah Allee Busk, PA-C 03/05/15 0045  Mancel BaleElliott Wentz, MD 03/05/15 [phone]

## 2015-03-04 NOTE — ED Notes (Signed)
Pt from home for eval on ongoing dizziness x1 month with nausea/vomiting. Pt also reports headache x1 week as well, no neuro deficits noted. Pt denies sob but also reports cp x3 days. Nad noted at this time.

## 2015-03-05 NOTE — ED Notes (Signed)
Pt. Left with all belongings 

## 2015-03-05 NOTE — ED Notes (Signed)
Pt. Was able to ambulate with out difficulties. PA notified

## 2015-03-05 NOTE — Discharge Instructions (Signed)
1. Medications: usual home medications 2. Treatment: rest, drink plenty of fluids,  3. Follow Up: Please followup with your primary doctor in 3 days for discussion of your diagnoses and further evaluation after today's visit; if you do not have a primary care doctor use the resource guide provided to find one; Please return to the ER for worsening symptoms, syncope or other concerns   Migraine Headache A migraine headache is an intense, throbbing pain on one or both sides of your head. A migraine can last for 30 minutes to several hours. CAUSES  The exact cause of a migraine headache is not always known. However, a migraine may be caused when nerves in the brain become irritated and release chemicals that cause inflammation. This causes pain. Certain things may also trigger migraines, such as:  Alcohol.  Smoking.  Stress.  Menstruation.  Aged cheeses.  Foods or drinks that contain nitrates, glutamate, aspartame, or tyramine.  Lack of sleep.  Chocolate.  Caffeine.  Hunger.  Physical exertion.  Fatigue.  Medicines used to treat chest pain (nitroglycerine), birth control pills, estrogen, and some blood pressure medicines. SIGNS AND SYMPTOMS  Pain on one or both sides of your head.  Pulsating or throbbing pain.  Severe pain that prevents daily activities.  Pain that is aggravated by any physical activity.  Nausea, vomiting, or both.  Dizziness.  Pain with exposure to bright lights, loud noises, or activity.  General sensitivity to bright lights, loud noises, or smells. Before you get a migraine, you may get warning signs that a migraine is coming (aura). An aura may include:  Seeing flashing lights.  Seeing bright spots, halos, or zigzag lines.  Having tunnel vision or blurred vision.  Having feelings of numbness or tingling.  Having trouble talking.  Having muscle weakness. DIAGNOSIS  A migraine headache is often diagnosed based  on:  Symptoms.  Physical exam.  A CT scan or MRI of your head. These imaging tests cannot diagnose migraines, but they can help rule out other causes of headaches. TREATMENT Medicines may be given for pain and nausea. Medicines can also be given to help prevent recurrent migraines.  HOME CARE INSTRUCTIONS  Only take over-the-counter or prescription medicines for pain or discomfort as directed by your health care provider. The use of long-term narcotics is not recommended.  Lie down in a dark, quiet room when you have a migraine.  Keep a journal to find out what may trigger your migraine headaches. For example, write down:  What you eat and drink.  How much sleep you get.  Any change to your diet or medicines.  Limit alcohol consumption.  Quit smoking if you smoke.  Get 7-9 hours of sleep, or as recommended by your health care provider.  Limit stress.  Keep lights dim if bright lights bother you and make your migraines worse. SEEK IMMEDIATE MEDICAL CARE IF:   Your migraine becomes severe.  You have a fever.  You have a stiff neck.  You have vision loss.  You have muscular weakness or loss of muscle control.  You start losing your balance or have trouble walking.  You feel faint or pass out.  You have severe symptoms that are different from your first symptoms. MAKE SURE YOU:   Understand these instructions.  Will watch your condition.  Will get help right away if you are not doing well or get worse. Document Released: 08/16/2005 Document Revised: 12/31/2013 Document Reviewed: 04/23/2013 [facility] Patient Information 2015 [place], [place]. This information  is not intended to replace advice given to you by your health care provider. Make sure you discuss any questions you have with your health care provider.   Emergency Department Resource Guide 1) Find a Doctor and Pay Out of Pocket Although you won't have to find out who is covered by your insurance plan, it  is a good idea to ask around and get recommendations. You will then need to call the office and see if the doctor you have chosen will accept you as a new patient and what types of options they offer for patients who are self-pay. Some doctors offer discounts or will set up payment plans for their patients who do not have insurance, but you will need to ask so you aren't surprised when you get to your appointment.  2) Contact Your Local Health Department Not all health departments have doctors that can see patients for sick visits, but many do, so it is worth a call to see if yours does. If you don't know where your local health department is, you can check in your phone book. The CDC also has a tool to help you locate your state's health department, and many state websites also have listings of all of their local health departments.  3) Find a Walk-in Clinic If your illness is not likely to be very severe or complicated, you may want to try a walk in clinic. These are popping up all over the country in pharmacies, drugstores, and shopping centers. They're usually staffed by nurse practitioners or physician assistants that have been trained to treat common illnesses and complaints. They're usually fairly quick and inexpensive. However, if you have serious medical issues or chronic medical problems, these are probably not your best option.  No Primary Care Doctor: - Call Health Connect at  [phone] - they can help you locate a primary care doctor that  accepts your insurance, provides certain services, etc. - Physician Referral Service- [phone]  Chronic Pain Problems: Organization         Address  Phone   Notes  [facility]  [phone] Patients need to be referred by their primary care doctor.   Medication Assistance: Organization         Address  Phone   Notes  [facility] Medication [facility] [address] [place] [place], [place] [address] [phone] --Must be a resident of [facility] -- Must have NO insurance coverage whatsoever (no Medicaid/ Medicare, etc.) -- The pt. MUST have a primary care doctor that directs their care regularly and follows them in the community   [facility]ist  [phone]   [employer]  [phone]    Agencies that provide inexpensive medical care: Organization         Address  Phone   Notes  [facility]  [phone]   [facility]    [phone]   [facility] [address] [place], [place] [address] [phone]   [facility] [address] [place] [place] [phone]   Planned Parenthood    [phone]   [facility]    [phone]   [facility]  [address], [place] Phone:  [phone], Fax:  [phone] Hours of Operation:  9 am - 6 pm, M-F.  Also accepts Medicaid/Medicare and self-pay.  [facility]  [address]  [address], [place] Phone: [phone], Fax: [phone]. Hours of Operation:  8:30 am - 5:30 pm, M-F.  Also accepts Medicaid and self-pay.  [facility] [place] [address] [place] Phone: [phone]   [facility] [address] Natasha Bence [place], [place] [phone], Ext. [phone] Mondays & Thursdays: 7-9 AM.  First 15 patients are seen on a first come, first serve basis.    Medicaid-accepting [facility] Providers:  Organization         Address  Phone   Notes  [facility] [address], [place] [phone] Also accepts self-pay patients.  [facility] [address] Laurell Josephs [place], [place]  [phone]   [facility] [address], [place] [phone]   [facility] [address], [place] [phone]   Renaye Rakers [address], [place]   [phone] Only accepts [place] Access [place] patients after they have their name applied to their card.   Self-Pay (no insurance) in [facility]:  Organization         Address  Phone   Notes  Sickle Cell Patients, [facility] [address] [place], [place] [phone]   [facility] [address] [place], [place] [phone]   [facility] [place]  [address], [place] [phone]   [facility]/Dr. Osei-Bonsu  [address], [place] or [address], [place] [phone] Phone number for both [place] and [place] locations is the same.  [facility]gent Medical and [facility] [address] [place] [phone]   [facility] [address], [place] or [address] [phone]   [facility] [address] [place] [phone], phone; [phone], fax Sees patients 1st and 3rd Saturday of every month.  Must not qualify for public or private insurance (i.e. Medicaid, Medicare, [place] Health Choice, Veterans' Benefits)  Household income should be no more than 200% of the poverty level The clinic cannot treat you if you are pregnant or think you are pregnant  Sexually transmitted diseases are not treated at the clinic.    Dental Care: Organization         Address  Phone  Notes  [facility] Department of [facility] [address] [place], [place] [phone] Accepts children up to age 99 who are enrolled in [place] or [place] Health Choice; pregnant women with a Medicaid card; and children who have applied for Medicaid or [place] Health Choice, but were declined, whose parents can pay a reduced fee at time of service.  [facility] Department of [facility]  [address], [place] [phone] Accepts children up to age 71 who are enrolled in [place] or [place] Health  Choice; pregnant women with a Medicaid card; and children who have applied for Medicaid or [place] Health Choice, but were declined, whose parents can pay a reduced fee at time of service.  [facility]  [address] [place], [place] [phone] Patients are seen by appointment only. Walk-ins are not accepted. [facility] will see patients 36 years of age and older. Monday - Tuesday (8am-5pm) Most Wednesdays (8:30-5pm) $30 per visit, cash only  [employer] [facility] [employer]  [address], [place] [phone] Patients are seen by appointment only. Walk-ins are not accepted. [facility] will see patients 28 years of age and older. One Wednesday Evening (Monthly: Volunteer Based).  $30 per visit, cash only  [employer] [facility]  [phone] for adults; Children under age 534, call Graduate Pediatric Dentistry at [phone]. Children aged 304-14, please call [phone] to request a pediatric application.  Dental services are provided in all areas of dental care including fillings, crowns and bridges, complete and partial dentures, implants, gum treatment, root canals, and extractions. Preventive care is also provided. Treatment is provided to both adults and children. Patients are selected via a lottery and there is often a waiting list.   [facility] [address], [place]  [phone] [email or web address]   [facility] [address], [place], [place] [phone], Ext. [phone] Second and Fourth Thursday of each month, opens at 6:30 AM; Clinic ends at 9 AM.  Patients are seen on a first-come first-served basis, and a limited number are seen during each clinic.   [facility]  [address], [place], [place] [phone]   Eligibility Requirements You must have lived in [place], [place], or [place] counties for at least the last three months.   You cannot be eligible for state or  federal sponsored [place]healthcare insurance, including CIG[employer]eterans Administration, [place]icaid, or [employer]icare.   You generally cannot be eligible for healthcare insurance through your employer.    How to apply: Eligibility screenings are held every Tuesday and Wednesday afternoon from 1:00 pm until 4:00 pm. You do not need an appointment for the interview!  [facility] [address] [place] [address], [place], [place] [phone]   [facility]  [phone]   [facility]  [phone]   [facility]  [phone]    Behavioral Health Resources in the Community: Intensive Outpatient Programs Organization         Address  Phone  Notes  [facility] [address], [place], [place] [phone]   [facility] [address], [place], [place] [phone]   [facility] [address], [place], [place]  [phone]   [facility] [address],  [place], [place] [phone] or [phone]   Substance Abuse Resources Organization         Address  Phone  Notes  [facility]  [phone]   [facility]  [phone]   [facility]  [phone]   [facility]  [phone]   Residential & Outpatient Substance Abuse Program  [phone]   Psychological Services Organization         Address  Phone  Notes  [facility]  [phone]- [phone]   [facility]  [phone]- [phone]   [facility] [address], [place] [phone] or [phone]    Mobile Crisis Teams Organization         Address  Phone  Notes  [facility], Mobile Crisis Care Unit  [phone]   [facility]  [address] [place], [place] [phone]   Doristine LocksSharon DeEsch [address] [place] [phone]    Self-Help/Support Groups Organization         Address  Phone              Notes  [facility] [place] -  variety of support groups  [phone]- [phone] Call for more information  Narcotics Anonymous (NA), [facility] [address], [employer] [place]  2 meetings at this location   Residential Sports administrator         Address  Phone  Notes  [facility] [address],    [place]  [phone]   [facility]  [address], [place] [address], [place], [place] [phone]   [facility] [address] [place], [place] [phone] Admissions: 8am-3pm M-F  [facility] [address]    [place], [place] [phone]   [facility] [address] [place], [place], [place] [phone]   [facility] [address]  [place], [place] [phone]   [facility] - Intensive Outpatient [address] Laurell Josephs [address], [place], [place] [phone]   [facility] ([facility] [address]  [place], [place] [phone] or [phone]   [facility] ([facility]) [address], [place], [place] [phone] Accepts Medicaid  [facility] [address],  [place] [phone] Substance Abuse/Addiction Treatment   [facility] Organization         Address  Phone  Notes  [facility]  [phone]   Angie Fava, PhD [address] Ervin Knack [place], [place]   [phone] or [phone]   [facility]al   [address] [place], [place] [phone]   [facility] [address], [place], [place] [phone] Insurance/Medicaid/sponsorship through [facility] [address]                                    [place], [place] [phone] Therapy/tele-psych/case  [facility] [address], [place] [phone]    Dr. Lolly Mustache  [phone]   [facility]  [employer] [facility]  Dept. 1) [address] [place] 2) [address] [place] 3)  [address], [place] [phone]  [phone]   [facility] Child Abuse Hotline [phone] or [phone] (After Hours)

## 2015-05-13 ENCOUNTER — Emergency Department (HOSPITAL_COMMUNITY)
Admission: EM | Admit: 2015-05-13 | Discharge: 2015-05-13 | Disposition: A | Discharge status: Home / Self Care | Payer: Self-pay | Attending: Emergency Medicine | Admitting: Emergency Medicine

## 2015-05-13 ENCOUNTER — Encounter (HOSPITAL_COMMUNITY): Payer: Self-pay | Admitting: Emergency Medicine

## 2015-05-13 DIAGNOSIS — J45909 Unspecified asthma, uncomplicated: Secondary | ICD-10-CM | POA: Insufficient documentation

## 2015-05-13 DIAGNOSIS — F209 Schizophrenia, unspecified: Secondary | ICD-10-CM | POA: Insufficient documentation

## 2015-05-13 DIAGNOSIS — Z79899 Other long term (current) drug therapy: Secondary | ICD-10-CM | POA: Insufficient documentation

## 2015-05-13 DIAGNOSIS — F319 Bipolar disorder, unspecified: Secondary | ICD-10-CM | POA: Insufficient documentation

## 2015-05-13 DIAGNOSIS — Z720 Tobacco use: Secondary | ICD-10-CM | POA: Insufficient documentation

## 2015-05-13 DIAGNOSIS — G43909 Migraine, unspecified, not intractable, without status migrainosus: Secondary | ICD-10-CM | POA: Insufficient documentation

## 2015-05-13 DIAGNOSIS — M546 Pain in thoracic spine: Secondary | ICD-10-CM | POA: Insufficient documentation

## 2015-05-13 MED ORDER — METHOCARBAMOL 500 MG PO TABS
500.0000 mg | ORAL_TABLET | Freq: Once | ORAL | Status: AC
  Administered 2015-05-13: 500 mg via ORAL
  Filled 2015-05-13: qty 1

## 2015-05-13 MED ORDER — METHOCARBAMOL 500 MG PO TABS: 500.0000 mg | ORAL_TABLET | Freq: Two times a day (BID) | ORAL | Status: DC | PRN

## 2015-05-13 MED ORDER — NAPROXEN 250 MG PO TABS: 250.0000 mg | ORAL_TABLET | Freq: Two times a day (BID) | ORAL | Status: DC

## 2015-05-13 MED ORDER — IBUPROFEN 800 MG PO TABS
800.0000 mg | ORAL_TABLET | Freq: Once | ORAL | Status: AC
  Administered 2015-05-13: 800 mg via ORAL
  Filled 2015-05-13: qty 1

## 2015-05-13 NOTE — ED Provider Notes (Signed)
History  This chart was scribed for non-physician practitioner, Will Marijo File, PA-C,working with Eber Hong, MD, by Karle Plumber, ED Scribe. This patient was seen in room WTR7/WTR7 and the patient's care was started at 4:28 PM.  Chief Complaint  Patient presents with  . Back Pain   The history is provided by the patient and medical records. No language interpreter was used.    HPI Comments:  Joyce Klein is a 29 y.o. female who presents to the Emergency Department complaining of mid back pain that began upon waking this morning. She rates the pain as 8/10. She reports taking Tylenol with no significant relief of the pain. She reports it feels like her back is in spasm. Movement and coughing makes the pain worse. She denies alleviating factors. She denies bowel or bladder incontinence or changes, numbness, tingling or weakness of any extremity, dysuria, hematuria, hematochezia, fever, chills, nausea, vomiting, CP, SOB or cough. She denies h/o IV drug use or cancer. She denies trauma, injury or fall. She does not currently have a PCP.  Past Medical History  Diagnosis Date  . Depression   . Headache(784.0)   . Shortness of breath   . Blood transfusion without reported diagnosis 2005    PPH  . Bipolar 1 disorder   . Schizophrenia   . Asthma   . Migraine    Past Surgical History  Procedure Laterality Date  . Tubal ligation    . Tubal ligation     Family History  Problem Relation Age of Onset  . Other Neg Hx   . Irritable bowel syndrome Mother    Social History  Substance Use Topics  . Smoking status: Current Every Day Smoker -- 0.50 packs/day for 14 years    Types: Cigarettes  . Smokeless tobacco: Never Used  . Alcohol Use: Yes     Comment: occasioanal   OB History    Gravida Para Term Preterm AB TAB SAB Ectopic Multiple Living   14 2 2  12  12   2      Review of Systems  Constitutional: Negative for fever and chills.  Eyes: Negative for visual disturbance.   Respiratory: Negative for cough, chest tightness, shortness of breath and wheezing.   Cardiovascular: Negative for chest pain.  Gastrointestinal: Negative for nausea, vomiting and abdominal pain.  Genitourinary: Negative for dysuria, urgency, frequency, hematuria, decreased urine volume and difficulty urinating.       No bowel or bladder incontinence  Musculoskeletal: Positive for back pain. Negative for neck pain and neck stiffness.  Skin: Negative for color change and wound.  Neurological: Negative for weakness, light-headedness, numbness and headaches.    Allergies  Bee venom; Coconut oil; and Other  Home Medications   Prior to Admission medications   Medication Sig Start Date End Date Taking? Authorizing Provider  albuterol (PROVENTIL HFA;VENTOLIN HFA) 108 (90 BASE) MCG/ACT inhaler Inhale 2 puffs into the lungs every 6 (six) hours as needed. For shortness of breath    Historical Provider, MD  aspirin-acetaminophen-caffeine (EXCEDRIN MIGRAINE) [phone] MG per tablet Take 1 tablet by mouth every 6 (six) hours as needed for headache.     Historical Provider, MD  citalopram (CELEXA) 20 MG tablet Take 20 mg by mouth 2 (two) times daily.    Historical Provider, MD  methocarbamol (ROBAXIN) 500 MG tablet Take 1 tablet (500 mg total) by mouth 2 (two) times daily as needed for muscle spasms. 05/13/15   Everlene Farrier, PA-C  naproxen (NAPROSYN) 250 MG tablet  Take 1 tablet (250 mg total) by mouth 2 (two) times daily with a meal. 05/13/15   Everlene Farrier, PA-C  topiramate (TOPAMAX) 25 MG tablet Take 25 mg by mouth daily as needed. For headache per patient    Historical Provider, MD  valACYclovir (VALTREX) 500 MG tablet Take 500 mg by mouth daily as needed.    Historical Provider, MD   Triage Vitals: BP 118/65 mmHg  Pulse 93  Temp(Src) 98 F (36.7 C) (Oral)  Resp 18  SpO2 98%  LMP 05/11/2015 Physical Exam  Constitutional: She is oriented to person, place, and time. She appears  well-developed and well-nourished. No distress.  Nontoxic appearing.  HENT:  Head: Normocephalic and atraumatic.  Eyes: Conjunctivae are normal. Pupils are equal, round, and reactive to light. Right eye exhibits no discharge. Left eye exhibits no discharge.  Neck: Normal range of motion. Neck supple. No JVD present. No tracheal deviation present.  No midline neck tenderness.  Cardiovascular: Normal rate, regular rhythm, normal heart sounds and intact distal pulses.   Bilateral radial pulses are intact.  Pulmonary/Chest: Effort normal and breath sounds normal. No respiratory distress. She has no wheezes. She has no rales. She exhibits no tenderness.  Abdominal: Soft. There is no tenderness. There is no guarding.  Musculoskeletal: Normal range of motion. She exhibits tenderness. She exhibits no edema.  No midline back or neck tenderness. No back edema, deformity or ecchymosis or erythema. No midline spine tenderness. He patient's bilateral thoracic back is tender to palpation with the lightest touch. Patient is wearing her bra without difficulty.The patient has excellent 5/5 strength in her bilateral upper and lower extremities. She is able to ambulate in the room.  No lower extremity edema or tenderness.  Lymphadenopathy:    She has no cervical adenopathy.  Neurological: She is alert and oriented to person, place, and time. She has normal reflexes. She displays normal reflexes. Coordination normal.  Reflex Scores:      Patellar reflexes are 2+ on the right side and 2+ on the left side. Grip strength 5/5 bilaterally. Sensation is intact to her bilateral upper and lower extremities. She is able to ambulate. Bilateral patellar DTRs are intact.  Skin: Skin is warm and dry. No rash noted. She is not diaphoretic. No erythema. No pallor.  Psychiatric: She has a normal mood and affect. Her behavior is normal.  Nursing note and vitals reviewed.   ED Course  Procedures (including critical care  time) DIAGNOSTIC STUDIES: Oxygen Saturation is 98% on RA, normal by my interpretation.   COORDINATION OF CARE: 4:37 PM- Offered injection of pain medication but pt requested oral medication. Will prescribe NSAID and muscle relaxer. Pt verbalizes understanding and agrees to plan.  Medications  ibuprofen (ADVIL,MOTRIN) tablet 800 mg (not administered)  methocarbamol (ROBAXIN) tablet 500 mg (not administered)     MDM   Meds given in ED:  Medications  ibuprofen (ADVIL,MOTRIN) tablet 800 mg (not administered)  methocarbamol (ROBAXIN) tablet 500 mg (not administered)    New Prescriptions   METHOCARBAMOL (ROBAXIN) 500 MG TABLET    Take 1 tablet (500 mg total) by mouth 2 (two) times daily as needed for muscle spasms.   NAPROXEN (NAPROSYN) 250 MG TABLET    Take 1 tablet (250 mg total) by mouth 2 (two) times daily with a meal.    Final diagnoses:  Bilateral thoracic back pain   This is a 29 year old female who presents to the emergency department complaining of mid back pain since awaking this  morning.  No neurological deficits and normal neuro exam.  Patient has 5 out of 5 strength in her bilateral upper and lower extremities.  No loss of bowel or bladder control.  No concern for cauda equina.  No fever, night sweats, weight loss, h/o cancer, IVDU. She was offered pain shot which she refused.  RICE protocol and pain medicine indicated and discussed with patient. I advised the patient to follow-up with their primary care provider this week. I advised the patient to return to the emergency department with new or worsening symptoms or new concerns. The patient verbalized understanding and agreement with plan.     I personally performed the services described in this documentation, which was scribed in my presence. The recorded information has been reviewed and is accurate.        Everlene Farrier, PA-C 05/13/15 1649  Eber Hong, MD 05/14/15 [phone]

## 2015-05-13 NOTE — ED Notes (Signed)
Pt reported mid back pain. Denies injury/trauma to area. Pt able to ambulate short distances.  (+)PMS and CRT brisk. Pt denies urinary problems at this time.

## 2015-05-13 NOTE — ED Notes (Signed)
Pt c/o mid center back pain starting this morning with intermittent tingling in bilateral feet. Rates 10/10.

## 2015-05-13 NOTE — Discharge Instructions (Signed)

## 2015-05-13 NOTE — ED Notes (Signed)
Awake. Verbally responsive. A/O x4. Resp even and unlabored. No audible adventitious breath sounds noted. ABC's intact.  

## 2015-09-04 ENCOUNTER — Encounter (HOSPITAL_COMMUNITY): Payer: Self-pay | Admitting: *Deleted

## 2015-09-04 ENCOUNTER — Emergency Department (HOSPITAL_COMMUNITY)
Admission: EM | Admit: 2015-09-04 | Discharge: 2015-09-04 | Discharge status: Against Medical Advice | Payer: Self-pay | Attending: Emergency Medicine | Admitting: Emergency Medicine

## 2015-09-04 DIAGNOSIS — J45909 Unspecified asthma, uncomplicated: Secondary | ICD-10-CM | POA: Insufficient documentation

## 2015-09-04 DIAGNOSIS — K047 Periapical abscess without sinus: Secondary | ICD-10-CM | POA: Insufficient documentation

## 2015-09-04 DIAGNOSIS — K0381 Cracked tooth: Secondary | ICD-10-CM | POA: Insufficient documentation

## 2015-09-04 DIAGNOSIS — R112 Nausea with vomiting, unspecified: Secondary | ICD-10-CM | POA: Insufficient documentation

## 2015-09-04 DIAGNOSIS — F17210 Nicotine dependence, cigarettes, uncomplicated: Secondary | ICD-10-CM | POA: Insufficient documentation

## 2015-09-04 LAB — CBC
HEMATOCRIT: 42.3 % (ref 36.0–46.0)
HEMOGLOBIN: 14 g/dL (ref 12.0–15.0)
MCH: 33.6 pg (ref 26.0–34.0)
MCHC: 33.1 g/dL (ref 30.0–36.0)
MCV: 101.4 fL — ABNORMAL HIGH (ref 78.0–100.0)
Platelets: 178 10*3/uL (ref 150–400)
RBC: 4.17 MIL/uL (ref 3.87–5.11)
RDW: 12.5 % (ref 11.5–15.5)
WBC: 6.8 10*3/uL (ref 4.0–10.5)

## 2015-09-04 LAB — COMPREHENSIVE METABOLIC PANEL
ALBUMIN: 3.7 g/dL (ref 3.5–5.0)
ALK PHOS: 49 U/L (ref 38–126)
ALT: 12 U/L — ABNORMAL LOW (ref 14–54)
ANION GAP: 8 (ref 5–15)
AST: 19 U/L (ref 15–41)
BILIRUBIN TOTAL: 0.3 mg/dL (ref 0.3–1.2)
BUN: 6 mg/dL (ref 6–20)
CALCIUM: 8.5 mg/dL — AB (ref 8.9–10.3)
CO2: 26 mmol/L (ref 22–32)
Chloride: 105 mmol/L (ref 101–111)
Creatinine, Ser: 0.84 mg/dL (ref 0.44–1.00)
GFR calc Af Amer: >60 mL/min (ref >60)
GFR calc non Af Amer: >60 mL/min (ref >60)
GLUCOSE: 110 mg/dL — AB (ref 65–99)
Potassium: 3.7 mmol/L (ref 3.5–5.1)
SODIUM: 139 mmol/L (ref 135–145)
TOTAL PROTEIN: 7.1 g/dL (ref 6.5–8.1)

## 2015-09-04 LAB — URINALYSIS, ROUTINE W REFLEX MICROSCOPIC
BILIRUBIN URINE: NEGATIVE
Glucose, UA: NEGATIVE mg/dL
HGB URINE DIPSTICK: NEGATIVE
KETONES UR: NEGATIVE mg/dL
Leukocytes, UA: NEGATIVE
Nitrite: NEGATIVE
PH: 6.5 (ref 5.0–8.0)
Protein, ur: NEGATIVE mg/dL
SPECIFIC GRAVITY, URINE: 1.038 — AB (ref 1.005–1.030)

## 2015-09-04 LAB — LIPASE, BLOOD: Lipase: 30 U/L (ref 11–51)

## 2015-09-04 LAB — POC URINE PREG, ED: Preg Test, Ur: NEGATIVE

## 2015-09-04 MED ORDER — ONDANSETRON 4 MG PO TBDP
4.0000 mg | ORAL_TABLET | Freq: Once | ORAL | Status: AC | PRN
  Administered 2015-09-04: 4 mg via ORAL

## 2015-09-04 MED ORDER — ONDANSETRON 4 MG PO TBDP
ORAL_TABLET | ORAL | Status: AC
  Filled 2015-09-04: qty 1

## 2015-09-04 NOTE — ED Notes (Signed)
Patient called for room 3x with no response

## 2015-09-04 NOTE — ED Notes (Signed)
Pt states that she has an abscessed tooth. Pain started yesterday. Tooth has been broken for several years. Pain is making pt nauseous.

## 2015-09-04 NOTE — ED Notes (Signed)
Pt states that she has vomited 6 times and now feels dizzy.

## 2015-09-05 ENCOUNTER — Encounter (HOSPITAL_COMMUNITY): Payer: Self-pay | Admitting: *Deleted

## 2015-09-05 ENCOUNTER — Emergency Department (HOSPITAL_COMMUNITY)
Admission: EM | Admit: 2015-09-05 | Discharge: 2015-09-05 | Disposition: A | Discharge status: Home / Self Care | Payer: Self-pay | Attending: Physician Assistant | Admitting: Physician Assistant

## 2015-09-05 DIAGNOSIS — G479 Sleep disorder, unspecified: Secondary | ICD-10-CM | POA: Insufficient documentation

## 2015-09-05 DIAGNOSIS — J45909 Unspecified asthma, uncomplicated: Secondary | ICD-10-CM | POA: Insufficient documentation

## 2015-09-05 DIAGNOSIS — R112 Nausea with vomiting, unspecified: Secondary | ICD-10-CM | POA: Insufficient documentation

## 2015-09-05 DIAGNOSIS — Z79899 Other long term (current) drug therapy: Secondary | ICD-10-CM | POA: Insufficient documentation

## 2015-09-05 DIAGNOSIS — T85848A Pain due to other internal prosthetic devices, implants and grafts, initial encounter: Secondary | ICD-10-CM | POA: Diagnosis present

## 2015-09-05 DIAGNOSIS — K002 Abnormalities of size and form of teeth: Secondary | ICD-10-CM | POA: Insufficient documentation

## 2015-09-05 DIAGNOSIS — R110 Nausea: Secondary | ICD-10-CM | POA: Insufficient documentation

## 2015-09-05 DIAGNOSIS — Z791 Long term (current) use of non-steroidal anti-inflammatories (NSAID): Secondary | ICD-10-CM | POA: Insufficient documentation

## 2015-09-05 DIAGNOSIS — F319 Bipolar disorder, unspecified: Secondary | ICD-10-CM | POA: Insufficient documentation

## 2015-09-05 DIAGNOSIS — G43909 Migraine, unspecified, not intractable, without status migrainosus: Secondary | ICD-10-CM | POA: Insufficient documentation

## 2015-09-05 DIAGNOSIS — F17210 Nicotine dependence, cigarettes, uncomplicated: Secondary | ICD-10-CM | POA: Insufficient documentation

## 2015-09-05 DIAGNOSIS — K0889 Other specified disorders of teeth and supporting structures: Secondary | ICD-10-CM | POA: Insufficient documentation

## 2015-09-05 MED ORDER — PENICILLIN V POTASSIUM 250 MG PO TABS
250.0000 mg | ORAL_TABLET | Freq: Once | ORAL | Status: AC
  Administered 2015-09-05: 250 mg via ORAL
  Filled 2015-09-05: qty 1

## 2015-09-05 MED ORDER — ONDANSETRON 4 MG PO TBDP
4.0000 mg | ORAL_TABLET | Freq: Once | ORAL | Status: AC | PRN
  Administered 2015-09-05: 4 mg via ORAL

## 2015-09-05 MED ORDER — ONDANSETRON 4 MG PO TBDP
ORAL_TABLET | ORAL | Status: AC
  Filled 2015-09-05: qty 1

## 2015-09-05 MED ORDER — HYDROCODONE-ACETAMINOPHEN 5-325 MG PO TABS: 1.0000 | ORAL_TABLET | Freq: Four times a day (QID) | ORAL | Status: DC | PRN

## 2015-09-05 MED ORDER — PENICILLIN V POTASSIUM 250 MG PO TABS: 250.0000 mg | ORAL_TABLET | Freq: Four times a day (QID) | ORAL | Status: AC

## 2015-09-05 MED ORDER — ONDANSETRON HCL 4 MG PO TABS: 4.0000 mg | ORAL_TABLET | Freq: Four times a day (QID) | ORAL | Status: DC

## 2015-09-05 NOTE — ED Provider Notes (Signed)
CSN: [id number]     Arrival date & time 09/05/15  1017 History  By signing my name below, I, Essence Howell, attest that this documentation has been prepared under the direction and in the presence of Roxy Horseman, PA-C Electronically Signed: Charline Bills, ED Scribe 09/05/2015 at 12:16 PM.   Chief Complaint  Patient presents with  . Dental Problem  . Emesis   The history is provided by the patient. No language interpreter was used.   HPI Comments: Joyce Klein is a 30 y.o. female who presents to the Emergency Department with a chief complaint of gradually worsening left upper rear dental pain onset yesterday. Pt reports moderate, constant pain that is exacerbated with palpation. She also reports associated gum swelling and nausea onset yesterday. Pt does not currently have a dentist. No known medical allergies.   Past Medical History  Diagnosis Date  . Depression   . Headache(784.0)   . Shortness of breath   . Blood transfusion without reported diagnosis 2005    PPH  . Bipolar 1 disorder (HCC)   . Schizophrenia (HCC)   . Asthma   . Migraine    Past Surgical History  Procedure Laterality Date  . Tubal ligation    . Tubal ligation     Family History  Problem Relation Age of Onset  . Other Neg Hx   . Irritable bowel syndrome Mother    Social History  Substance Use Topics  . Smoking status: Current Every Day Smoker -- 0.50 packs/day for 14 years    Types: Cigarettes  . Smokeless tobacco: Never Used  . Alcohol Use: Yes     Comment: occasioanal   OB History    Gravida Para Term Preterm AB TAB SAB Ectopic Multiple Living   14 2 2  12  12   2      Review of Systems  Constitutional: Negative for fever and chills.  HENT: Positive for dental problem. Negative for drooling.   Gastrointestinal: Positive for nausea.  Neurological: Negative for speech difficulty.  Psychiatric/Behavioral: Positive for sleep disturbance.   Allergies  Bee venom; Coconut oil; and  Other  Home Medications   Prior to Admission medications   Medication Sig Start Date End Date Taking? Authorizing Provider  albuterol (PROVENTIL HFA;VENTOLIN HFA) 108 (90 BASE) MCG/ACT inhaler Inhale 2 puffs into the lungs every 6 (six) hours as needed. For shortness of breath    Historical Provider, MD  aspirin-acetaminophen-caffeine (EXCEDRIN MIGRAINE) [phone] MG per tablet Take 1 tablet by mouth every 6 (six) hours as needed for headache.     Historical Provider, MD  citalopram (CELEXA) 20 MG tablet Take 20 mg by mouth 2 (two) times daily.    Historical Provider, MD  methocarbamol (ROBAXIN) 500 MG tablet Take 1 tablet (500 mg total) by mouth 2 (two) times daily as needed for muscle spasms. 05/13/15   Everlene Farrier, PA-C  naproxen (NAPROSYN) 250 MG tablet Take 1 tablet (250 mg total) by mouth 2 (two) times daily with a meal. 05/13/15   Everlene Farrier, PA-C  topiramate (TOPAMAX) 25 MG tablet Take 25 mg by mouth daily as needed. For headache per patient    Historical Provider, MD  valACYclovir (VALTREX) 500 MG tablet Take 500 mg by mouth daily as needed.    Historical Provider, MD   BP 109/81 mmHg  Pulse 85  Temp(Src) 99.1 F (37.3 C) (Oral)  Resp 18  SpO2 99%  LMP 08/13/2015 Physical Exam  Constitutional: She is oriented to person,  place, and time. She appears well-developed and well-nourished. No distress.  HENT:  Head: Normocephalic and atraumatic.  Mouth/Throat:    Poor dentition throughout.  Affected tooth as diagrammed.  No signs of peritonsillar or tonsillar abscess.  No signs of gingival abscess. Oropharynx is clear and without exudates.  Uvula is midline.  Airway is intact. No signs of Ludwig's angina with palpation of oral and sublingual mucosa.   Eyes: Conjunctivae and EOM are normal.  Neck: Normal range of motion. Neck supple. No tracheal deviation present.  Cardiovascular: Normal rate.   Pulmonary/Chest: Effort normal. No respiratory distress.  Abdominal: Soft. She  exhibits no distension and no mass. There is no tenderness. There is no rebound and no guarding.  Musculoskeletal: Normal range of motion.  Neurological: She is alert and oriented to person, place, and time.  Skin: Skin is warm and dry.  Psychiatric: She has a normal mood and affect. Her behavior is normal. Judgment and thought content normal.  Nursing note and vitals reviewed.  ED Course  Procedures (including critical care time) DIAGNOSTIC STUDIES: Oxygen Saturation is 99% on RA, normal by my interpretation.    COORDINATION OF CARE: 12:11 PM-Discussed treatment plan which includes Veetid, Zofran and Norco with pt at bedside and pt agreed to plan.     MDM   Final diagnoses:  Non-intractable vomiting with nausea, vomiting of unspecified type  Pain, dental    Patient with toothache.  No gross abscess.  Exam unconcerning for Ludwig's angina or spread of infection.  Will treat with penicillin and OTC pain medicine.  Urged patient to follow-up with dentist.    Tolerating PO in ED.  No abdominal pain.  Only nausea.  I personally performed the services described in this documentation, which was scribed in my presence. The recorded information has been reviewed and is accurate.      Roxy Horsemanobert Nesreen Albano, PA-C 09/05/15 1413  Courteney Randall AnLyn Mackuen, MD 09/06/15 [id number]

## 2015-09-05 NOTE — ED Notes (Signed)
Pt reports left side dental pain and abscess x 2 days. Now having fever and n/v.

## 2015-09-05 NOTE — Discharge Instructions (Signed)
Nausea and Vomiting °Nausea is a sick feeling that often comes before throwing up (vomiting). Vomiting is a reflex where stomach contents come out of your mouth. Vomiting can cause severe loss of body fluids (dehydration). Children and elderly adults can become dehydrated quickly, especially if they also have diarrhea. Nausea and vomiting are symptoms of a condition or disease. It is important to find the cause of your symptoms. °CAUSES  °· Direct irritation of the stomach lining. This irritation can result from increased acid production (gastroesophageal reflux disease), infection, food poisoning, taking certain medicines (such as nonsteroidal anti-inflammatory drugs), alcohol use, or tobacco use. °· Signals from the brain. These signals could be caused by a headache, heat exposure, an inner ear disturbance, increased pressure in the brain from injury, infection, a tumor, or a concussion, pain, emotional stimulus, or metabolic problems. °· An obstruction in the gastrointestinal tract (bowel obstruction). °· Illnesses such as diabetes, hepatitis, gallbladder problems, appendicitis, kidney problems, cancer, sepsis, atypical symptoms of a heart attack, or eating disorders. °· Medical treatments such as chemotherapy and radiation. °· Receiving medicine that makes you sleep (general anesthetic) during surgery. °DIAGNOSIS °Your caregiver may ask for tests to be done if the problems do not improve after a few days. Tests may also be done if symptoms are severe or if the reason for the nausea and vomiting is not clear. Tests may include: °· Urine tests. °· Blood tests. °· Stool tests. °· Cultures (to look for evidence of infection). °· X-rays or other imaging studies. °Test results can help your caregiver make decisions about treatment or the need for additional tests. °TREATMENT °You need to stay well hydrated. Drink frequently but in small amounts. You may wish to drink water, sports drinks, clear broth, or eat frozen  ice pops or gelatin dessert to help stay hydrated. When you eat, eating slowly may help prevent nausea. There are also some antinausea medicines that may help prevent nausea. °HOME CARE INSTRUCTIONS  °· Take all medicine as directed by your caregiver. °· If you do not have an appetite, do not force yourself to eat. However, you must continue to drink fluids. °· If you have an appetite, eat a normal diet unless your caregiver tells you differently. °¨ Eat a variety of complex carbohydrates (rice, wheat, potatoes, bread), lean meats, yogurt, fruits, and vegetables. °¨ Avoid high-fat foods because they are more difficult to digest. °· Drink enough water and fluids to keep your urine clear or pale yellow. °· If you are dehydrated, ask your caregiver for specific rehydration instructions. Signs of dehydration may include: °¨ Severe thirst. °¨ Dry lips and mouth. °¨ Dizziness. °¨ Dark urine. °¨ Decreasing urine frequency and amount. °¨ Confusion. °¨ Rapid breathing or pulse. °SEEK IMMEDIATE MEDICAL CARE IF:  °· You have blood or brown flecks (like coffee grounds) in your vomit. °· You have black or bloody stools. °· You have a severe headache or stiff neck. °· You are confused. °· You have severe abdominal pain. °· You have chest pain or trouble breathing. °· You do not urinate at least once every 8 hours. °· You develop cold or clammy skin. °· You continue to vomit for longer than 24 to 48 hours. °· You have a fever. °MAKE SURE YOU:  °· Understand these instructions. °· Will watch your condition. °· Will get help right away if you are not doing well or get worse. °  °This information is not intended to replace advice given to you by your health care provider. Make sure   you discuss any questions you have with your health care provider.   Document Released: 08/16/2005 Document Revised: 11/08/2011 Document Reviewed: 01/13/2011 Elsevier Interactive Patient Education 2016 [employer] Dental Pain Dental pain may be  caused by many things, including:  Tooth decay (cavities or caries). Cavities expose the nerve of your tooth to air and hot or cold temperatures. This can cause pain or discomfort.  Abscess or infection. A dental abscess is a collection of infected pus from a bacterial infection in the inner part of the tooth (pulp). It usually occurs at the end of the tooth's root.  Injury.  An unknown reason (idiopathic). Your pain may be mild or severe. It may only occur when:  You are chewing.  You are exposed to hot or cold temperature.  You are eating or drinking sugary foods or beverages, such as soda or candy. Your pain may also be constant. HOME CARE INSTRUCTIONS Watch your dental pain for any changes. The following actions may help to lessen any discomfort that you are feeling:  Take medicines only as directed by your dentist.  If you were prescribed an antibiotic medicine, finish all of it even if you start to feel better.  Keep all follow-up visits as directed by your dentist. This is important.  Do not apply heat to the outside of your face.  Rinse your mouth or gargle with salt water if directed by your dentist. This helps with pain and swelling.  You can make salt water by adding  tsp of salt to 1 cup of warm water.  Apply ice to the painful area of your face:  Put ice in a plastic bag.  Place a towel between your skin and the bag.  Leave the ice on for 20 minutes, 2-3 times per day.  Avoid foods or drinks that cause you pain, such as:  Very hot or very cold foods or drinks.  Sweet or sugary foods or drinks. SEEK MEDICAL CARE IF:  Your pain is not controlled with medicines.  Your symptoms are worse.  You have new symptoms. SEEK IMMEDIATE MEDICAL CARE IF:  You are unable to open your mouth.  You are having trouble breathing or swallowing.  You have a fever.  Your face, neck, or jaw is swollen.   This information is not intended to replace advice given to  you by your health care provider. Make sure you discuss any questions you have with your health care provider.   Document Released: 08/16/2005 Document Revised: 12/31/2014 Document Reviewed: 08/12/2014 Elsevier Interactive Patient Education [employer]2016 [employer]

## 2016-08-10 ENCOUNTER — Emergency Department (HOSPITAL_COMMUNITY)
Admission: EM | Admit: 2016-08-10 | Discharge: 2016-08-10 | Disposition: A | Discharge status: Home / Self Care | Payer: Self-pay | Attending: Emergency Medicine | Admitting: Emergency Medicine

## 2016-08-10 ENCOUNTER — Encounter (HOSPITAL_COMMUNITY): Payer: Self-pay | Admitting: *Deleted

## 2016-08-10 DIAGNOSIS — Z7982 Long term (current) use of aspirin: Secondary | ICD-10-CM | POA: Insufficient documentation

## 2016-08-10 DIAGNOSIS — F17210 Nicotine dependence, cigarettes, uncomplicated: Secondary | ICD-10-CM | POA: Insufficient documentation

## 2016-08-10 DIAGNOSIS — R519 Headache, unspecified: Secondary | ICD-10-CM

## 2016-08-10 DIAGNOSIS — Z79899 Other long term (current) drug therapy: Secondary | ICD-10-CM | POA: Insufficient documentation

## 2016-08-10 DIAGNOSIS — R51 Headache: Secondary | ICD-10-CM | POA: Insufficient documentation

## 2016-08-10 DIAGNOSIS — J45909 Unspecified asthma, uncomplicated: Secondary | ICD-10-CM | POA: Insufficient documentation

## 2016-08-10 MED ORDER — KETOROLAC TROMETHAMINE 30 MG/ML IJ SOLN
30.0000 mg | Freq: Once | INTRAMUSCULAR | Status: AC
  Administered 2016-08-10: 30 mg via INTRAVENOUS
  Filled 2016-08-10: qty 1

## 2016-08-10 MED ORDER — SODIUM CHLORIDE 0.9 % IV BOLUS (SEPSIS)
1000.0000 mL | Freq: Once | INTRAVENOUS | Status: AC
  Administered 2016-08-10: 1000 mL via INTRAVENOUS

## 2016-08-10 MED ORDER — METOCLOPRAMIDE HCL 5 MG/ML IJ SOLN
10.0000 mg | Freq: Once | INTRAMUSCULAR | Status: AC
Start: 2016-08-10 — End: 2016-08-10
  Administered 2016-08-10: 10 mg via INTRAVENOUS
  Filled 2016-08-10: qty 2

## 2016-08-10 MED ORDER — HYDROCODONE-ACETAMINOPHEN 5-325 MG PO TABS: 1.0000 | ORAL_TABLET | Freq: Four times a day (QID) | ORAL | 0 refills | Status: DC | PRN

## 2016-08-10 MED ORDER — DIPHENHYDRAMINE HCL 50 MG/ML IJ SOLN
25.0000 mg | Freq: Once | INTRAMUSCULAR | Status: AC
  Administered 2016-08-10: 25 mg via INTRAVENOUS
  Filled 2016-08-10: qty 1

## 2016-08-10 NOTE — ED Provider Notes (Signed)
[facility]-EMERGENCY DEPT Provider Note   CSN: [id number] Arrival date & time: 08/10/16  1709     History   Chief Complaint Chief Complaint  Patient presents with  . Migraine    HPI Joyce Klein is a 30 y.o. female.  Pt complains of a headache with vomiting for a couple days   The history is provided by the patient. No language interpreter was used.  Migraine  This is a new problem. The current episode started 12 to 24 hours ago. The problem occurs constantly. The problem has not changed since onset.Associated symptoms include headaches. Pertinent negatives include no chest pain and no abdominal pain. Nothing aggravates the symptoms. Nothing relieves the symptoms. She has tried nothing for the symptoms.    Past Medical History:  Diagnosis Date  . Asthma   . Bipolar 1 disorder (HCC)   . Blood transfusion without reported diagnosis 2005   PPH  . Depression   . Headache(784.0)   . Migraine   . Schizophrenia (HCC)   . Shortness of breath     Patient Active Problem List   Diagnosis Date Noted  . Dental implant pain 09/05/2015  . Bipolar affect, depressed (HCC) 03/14/2012  . History of suicide attempt 03/14/2012  . Drug abuse 03/14/2012  . Obesity 03/14/2012  . Smoker 03/14/2012  . H/O tubal ligation 03/14/2012  . Migraines 03/14/2012  . GERD (gastroesophageal reflux disease) 03/14/2012  . Gallstones 03/14/2012    Past Surgical History:  Procedure Laterality Date  . TUBAL LIGATION    . TUBAL LIGATION      OB History    Gravida Para Term Preterm AB Living   14 2 2   12 2    SAB TAB Ectopic Multiple Live Births   12       2       Home Medications    Prior to Admission medications   Medication Sig Start Date End Date Taking? Authorizing Provider  acetaminophen (TYLENOL) 325 MG tablet Take 975 mg by mouth every 6 (six) hours as needed for headache.   Yes Historical Provider, MD  gabapentin (NEURONTIN) 300 MG capsule Take 300 mg by mouth 2 (two) times  daily.   Yes Historical Provider, MD  methocarbamol (ROBAXIN) 500 MG tablet Take 1 tablet (500 mg total) by mouth 2 (two) times daily as needed for muscle spasms. 05/13/15  Yes Everlene Farrier, PA-C  topiramate (TOPAMAX) 25 MG tablet Take 25 mg by mouth daily as needed. For headache per patient   Yes Historical Provider, MD  albuterol (PROVENTIL HFA;VENTOLIN HFA) 108 (90 BASE) MCG/ACT inhaler Inhale 2 puffs into the lungs every 6 (six) hours as needed. For shortness of breath    Historical Provider, MD  aspirin-acetaminophen-caffeine (EXCEDRIN MIGRAINE) [phone] MG per tablet Take 1 tablet by mouth every 6 (six) hours as needed for headache.     Historical Provider, MD  citalopram (CELEXA) 20 MG tablet Take 20 mg by mouth 2 (two) times daily.    Historical Provider, MD  HYDROcodone-acetaminophen (NORCO/VICODIN) 5-325 MG tablet Take 1 tablet by mouth every 6 (six) hours as needed for moderate pain. 08/10/16   Bethann Berkshire, MD  naproxen (NAPROSYN) 250 MG tablet Take 1 tablet (250 mg total) by mouth 2 (two) times daily with a meal. Patient not taking: Reported on 08/10/2016 05/13/15   Everlene Farrier, PA-C  ondansetron (ZOFRAN) 4 MG tablet Take 1 tablet (4 mg total) by mouth every 6 (six) hours. Patient not taking: Reported on 08/10/2016  09/05/15   Roxy Horsemanobert Browning, PA-C  valACYclovir (VALTREX) 500 MG tablet Take 500 mg by mouth daily as needed.    Historical Provider, MD    Family History Family History  Problem Relation Age of Onset  . Irritable bowel syndrome Mother   . Other Neg Hx     Social History Social History  Substance Use Topics  . Smoking status: Current Every Day Smoker    Packs/day: 0.50    Years: 14.00    Types: Cigarettes  . Smokeless tobacco: Never Used  . Alcohol use Yes     Comment: occasioanal     Allergies   Bee venom; Coconut oil; and Other   Review of Systems Review of Systems  Constitutional: Negative for appetite change and fatigue.  HENT: Negative for  congestion, ear discharge and sinus pressure.   Eyes: Negative for discharge.  Respiratory: Negative for cough.   Cardiovascular: Negative for chest pain.  Gastrointestinal: Negative for abdominal pain and diarrhea.  Genitourinary: Negative for frequency and hematuria.  Musculoskeletal: Negative for back pain.  Skin: Negative for rash.  Neurological: Positive for headaches. Negative for seizures.  Psychiatric/Behavioral: Negative for hallucinations.     Physical Exam Updated Vital Signs BP 112/70   Pulse (!) 58   Temp 98.4 F (36.9 C) (Oral)   Resp 16   LMP 08/10/2016   SpO2 97%   Physical Exam  Constitutional: She is oriented to person, place, and time. She appears well-developed.  HENT:  Head: Normocephalic.  Eyes: Conjunctivae and EOM are normal. No scleral icterus.  Neck: Neck supple. No thyromegaly present.  Cardiovascular: Normal rate and regular rhythm.  Exam reveals no gallop and no friction rub.   No murmur heard. Pulmonary/Chest: No stridor. She has no wheezes. She has no rales. She exhibits no tenderness.  Abdominal: She exhibits no distension. There is no tenderness. There is no rebound.  Musculoskeletal: Normal range of motion. She exhibits no edema.  Lymphadenopathy:    She has no cervical adenopathy.  Neurological: She is oriented to person, place, and time. She exhibits normal muscle tone. Coordination normal.  Skin: No rash noted. No erythema.  Psychiatric: She has a normal mood and affect. Her behavior is normal.     ED Treatments / Results  Labs (all labs ordered are listed, but only abnormal results are displayed) Labs Reviewed - No data to display  EKG  EKG Interpretation None       Radiology No results found.  Procedures Procedures (including critical care time)  Medications Ordered in ED Medications  sodium chloride 0.9 % bolus 1,000 mL (0 mLs Intravenous Stopped 08/10/16 2145)  ketorolac (TORADOL) 30 MG/ML injection 30 mg (30 mg  Intravenous Given 08/10/16 2107)  metoCLOPramide (REGLAN) injection 10 mg (10 mg Intravenous Given 08/10/16 2107)  diphenhydrAMINE (BENADRYL) injection 25 mg (25 mg Intravenous Given 08/10/16 2107)     Initial Impression / Assessment and Plan / ED Course  I have reviewed the triage vital signs and the nursing notes.  Pertinent labs & imaging results that were available during my care of the patient were reviewed by me and considered in my medical decision making (see chart for details).  Clinical Course    Pt improved with tx.  Dx headache.  Pt sent home with vicodin and will follow up as needed Final Clinical Impressions(s) / ED Diagnoses   Final diagnoses:  Bad headache    New Prescriptions New Prescriptions   HYDROCODONE-ACETAMINOPHEN (NORCO/VICODIN) 5-325 MG TABLET  Take 1 tablet by mouth every 6 (six) hours as needed for moderate pain.     Bethann BerkshireJoseph Xaiden Fleig, MD 08/10/16 2206

## 2016-08-10 NOTE — Discharge Instructions (Signed)
Follow up if not improving

## 2016-08-10 NOTE — ED Triage Notes (Signed)
Pt reports having a migraine x 3 days with n/v and sensitivity to light. Hx of migraines but no relief with meds at home.

## 2016-08-10 NOTE — ED Notes (Signed)
1L bolus started at 2015

## 2016-08-10 NOTE — ED Notes (Signed)
Pt given warm blanket. Pt informed of wait time.

## 2018-01-31 ENCOUNTER — Emergency Department (HOSPITAL_COMMUNITY)
Admission: EM | Admit: 2018-01-31 | Discharge: 2018-01-31 | Disposition: A | Discharge status: Home / Self Care | Payer: Self-pay | Attending: Emergency Medicine | Admitting: Emergency Medicine

## 2018-01-31 ENCOUNTER — Emergency Department (HOSPITAL_COMMUNITY): Payer: Self-pay

## 2018-01-31 ENCOUNTER — Encounter (HOSPITAL_COMMUNITY): Payer: Self-pay | Admitting: Emergency Medicine

## 2018-01-31 DIAGNOSIS — J45909 Unspecified asthma, uncomplicated: Secondary | ICD-10-CM | POA: Insufficient documentation

## 2018-01-31 DIAGNOSIS — M533 Sacrococcygeal disorders, not elsewhere classified: Secondary | ICD-10-CM | POA: Insufficient documentation

## 2018-01-31 DIAGNOSIS — Z79899 Other long term (current) drug therapy: Secondary | ICD-10-CM | POA: Insufficient documentation

## 2018-01-31 DIAGNOSIS — F17210 Nicotine dependence, cigarettes, uncomplicated: Secondary | ICD-10-CM | POA: Insufficient documentation

## 2018-01-31 DIAGNOSIS — S3992XA Unspecified injury of lower back, initial encounter: Secondary | ICD-10-CM

## 2018-01-31 LAB — PREGNANCY, URINE: Preg Test, Ur: NEGATIVE

## 2018-01-31 MED ORDER — HYDROCODONE-ACETAMINOPHEN 5-325 MG PO TABS: 1.0000 | ORAL_TABLET | Freq: Four times a day (QID) | ORAL | 0 refills | Status: DC | PRN

## 2018-01-31 MED ORDER — DOCUSATE SODIUM 250 MG PO CAPS: 250.0000 mg | ORAL_CAPSULE | Freq: Every day | ORAL | 0 refills | Status: DC

## 2018-01-31 MED ORDER — IBUPROFEN 600 MG PO TABS: 600.0000 mg | ORAL_TABLET | Freq: Four times a day (QID) | ORAL | 0 refills | Status: DC | PRN

## 2018-01-31 NOTE — ED Notes (Signed)
Pt states that she has had 3 falls on her bottom, the first being going down a rocky bank, the last two r/t tripping on her dog.

## 2018-01-31 NOTE — ED Provider Notes (Addendum)
[facility] EMERGENCY DEPARTMENT Provider Note   CSN: [id number] Arrival date & time: 01/31/18  1206     History   Chief Complaint Chief Complaint  Patient presents with  . Rectal Pain    HPI Joyce Klein is a 32 y.o. female with history of bipolar 1 disorder, hernia, asthma who presents with a 2-week history of tailbone pain.  Patient reports she slipped on some gravel  downhill and fell on her buttocks.  She has had pain with walking, sitting, and moving her bowels since.  She reports she is only moves her bowels once because of pain.  She denies rectal pain, but moving her bowels makes her tailbone hurt.  She has fallen twice in the past 2 days because her dog tripped her.  She reports she was having some improvement taking ibuprofen and Tylenol at home prior to the more recent falls, however the pain is worse now.  She denies any saddle anesthesia, numbness or tingling in her legs, urinary symptoms.  Patient reports hitting her head on a tree when she fell 2 weeks ago, but did not lose consciousness.  She had mild dizziness for a few minutes, but this is resolved.  HPI  Past Medical History:  Diagnosis Date  . Asthma   . Bipolar 1 disorder (HCC)   . Blood transfusion without reported diagnosis 2005   PPH  . Depression   . Headache(784.0)   . Migraine   . Schizophrenia (HCC)   . Shortness of breath     Patient Active Problem List   Diagnosis Date Noted  . Dental implant pain 09/05/2015  . Bipolar affect, depressed (HCC) 03/14/2012  . History of suicide attempt 03/14/2012  . Drug abuse (HCC) 03/14/2012  . Obesity 03/14/2012  . Smoker 03/14/2012  . H/O tubal ligation 03/14/2012  . Migraines 03/14/2012  . GERD (gastroesophageal reflux disease) 03/14/2012  . Gallstones 03/14/2012    Past Surgical History:  Procedure Laterality Date  . TUBAL LIGATION    . TUBAL LIGATION       OB History    Gravida  14   Para  2   Term  2   Preterm      AB  12   Living  2     SAB  12   TAB      Ectopic      Multiple      Live Births  2            Home Medications    Prior to Admission medications   Medication Sig Start Date End Date Taking? Authorizing Provider  acetaminophen (TYLENOL) 325 MG tablet Take 975 mg by mouth every 6 (six) hours as needed for headache.    [provider]  albuterol (PROVENTIL HFA;VENTOLIN HFA) 108 (90 BASE) MCG/ACT inhaler Inhale 2 puffs into the lungs every 6 (six) hours as needed. For shortness of breath    [provider]  aspirin-acetaminophen-caffeine (EXCEDRIN MIGRAINE) [phone] MG per tablet Take 1 tablet by mouth every 6 (six) hours as needed for headache.     [provider]  citalopram (CELEXA) 20 MG tablet Take 20 mg by mouth 2 (two) times daily.    [provider]  docusate sodium (COLACE) 250 MG capsule Take 1 capsule (250 mg total) by mouth daily. 01/31/18   Gaynel Schaafsma, Waylan Boga, PA-C  gabapentin (NEURONTIN) 300 MG capsule Take 300 mg by mouth 2 (two) times daily.    [provider]  HYDROcodone-acetaminophen (NORCO/VICODIN) 5-325 MG tablet Take 1-2 tablets by mouth every 6 (six) hours as needed. 01/31/18   Kolbi Altadonna, Waylan Boga, PA-C  ibuprofen (ADVIL,MOTRIN) 600 MG tablet Take 1 tablet (600 mg total) by mouth every 6 (six) hours as needed. 01/31/18   Tyreonna Czaplicki, Waylan Boga, PA-C  methocarbamol (ROBAXIN) 500 MG tablet Take 1 tablet (500 mg total) by mouth 2 (two) times daily as needed for muscle spasms. 05/13/15   Everlene Farrier, PA-C  naproxen (NAPROSYN) 250 MG tablet Take 1 tablet (250 mg total) by mouth 2 (two) times daily with a meal. Patient not taking: Reported on 08/10/2016 05/13/15   Everlene Farrier, PA-C  ondansetron (ZOFRAN) 4 MG tablet Take 1 tablet (4 mg total) by mouth every 6 (six) hours. Patient not taking: Reported on 08/10/2016 09/05/15   Roxy Horseman, PA-C  topiramate (TOPAMAX) 25 MG tablet Take 25 mg by mouth daily as needed. For headache  per patient    [provider]  valACYclovir (VALTREX) 500 MG tablet Take 500 mg by mouth daily as needed.    [provider]    Family History Family History  Problem Relation Age of Onset  . Irritable bowel syndrome Mother   . Other Neg Hx     Social History Social History   Tobacco Use  . Smoking status: Current Every Day Smoker    Packs/day: 0.50    Years: 14.00    Pack years: 7.00    Types: Cigarettes  . Smokeless tobacco: Never Used  Substance Use Topics  . Alcohol use: Yes    Comment: occasioanal  . Drug use: Yes    Types: Marijuana    Comment: last used 04/24/13     Allergies   Bee venom; Coconut oil; and Other   Review of Systems Review of Systems  Gastrointestinal: Positive for constipation.  Musculoskeletal: Positive for back pain.  Neurological: Negative for numbness.     Physical Exam Updated Vital Signs BP 104/69 (BP Location: Right Arm)   Pulse 84   Temp 98 F (36.7 C) (Oral)   Resp 16   LMP 01/01/2018   SpO2 100%   Physical Exam  Constitutional: She appears well-developed and well-nourished. No distress.  HENT:  Head: Normocephalic and atraumatic.  Mouth/Throat: Oropharynx is clear and moist. No oropharyngeal exudate.  Eyes: Pupils are equal, round, and reactive to light. Conjunctivae are normal. Right eye exhibits no discharge. Left eye exhibits no discharge. No scleral icterus.  Neck: Normal range of motion. Neck supple. No thyromegaly present.  Cardiovascular: Normal rate, regular rhythm, normal heart sounds and intact distal pulses. Exam reveals no gallop and no friction rub.  No murmur heard. Pulmonary/Chest: Effort normal and breath sounds normal. No stridor. No respiratory distress. She has no wheezes. She has no rales.  Abdominal: Soft. Bowel sounds are normal. She exhibits no distension. There is no tenderness. There is no rebound and no guarding.  Musculoskeletal: She exhibits no edema.       Legs: Tenderness  palpation to the lower lumbar spine  and sacrum and coccyx, no tenderness past the coccyx, no erythema or wound  Lymphadenopathy:    She has no cervical adenopathy.  Neurological: She is alert. Coordination normal.  Skin: Skin is warm and dry. No rash noted. She is not diaphoretic. No pallor.  Psychiatric: She has a normal mood and affect.  Nursing note and vitals reviewed.    ED Treatments / Results  Labs (all labs ordered are listed,  but only abnormal results are displayed) Labs Reviewed  PREGNANCY, URINE    EKG None  Radiology Dg Lumbar Spine Complete  Result Date: 01/31/2018 CLINICAL DATA:  Acute low back pain following fall 2 weeks ago and 2 days ago. Initial encounter. EXAM: LUMBAR SPINE - COMPLETE 4+ VIEW COMPARISON:  None. FINDINGS: There is no evidence of lumbar spine fracture. Alignment is normal. Intervertebral disc spaces are maintained. IMPRESSION: Negative. Electronically Signed   By: Harmon PierJeffrey  Hu M.D.   On: 01/31/2018 14:54   Dg Sacrum/coccyx  Result Date: 01/31/2018 CLINICAL DATA:  Acute sacral and coccygeal pain following fall 2 weeks ago and 2 days ago. Initial encounter. EXAM: SACRUM AND COCCYX - 2+ VIEW COMPARISON:  07/01/2013 and prior radiographs FINDINGS: There is no evidence of fracture or other focal bone lesions. IMPRESSION: Negative. Electronically Signed   By: Harmon PierJeffrey  Hu M.D.   On: 01/31/2018 14:44    Procedures Procedures (including critical care time)  Medications Ordered in ED Medications - No data to display   Initial Impression / Assessment and Plan / ED Course  I have reviewed the triage vital signs and the nursing notes.  Pertinent labs & imaging results that were available during my care of the patient were reviewed by me and considered in my medical decision making (see chart for details).     Patient with coccydynia after several falls on her buttocks over the past 2 weeks.  Patient denies rectal involvement.  X-rays are negative.   Supportive treatment discussed including ice, warm soaks, donut pillow.  Will discharge home with short course of Norco for pain control as well as ibuprofen and Colace.  I reviewed the [place] narcotic database and found no discrepancies. Patient advised to follow-up and establish care with PCP.  Return precautions discussed.  Patient understands and agrees with plan.  Patient vitals stable throughout ED course and discharged in satisfactory condition.  Final Clinical Impressions(s) / ED Diagnoses   Final diagnoses:  Injury of coccyx, initial encounter    ED Discharge Orders        Ordered    HYDROcodone-acetaminophen (NORCO/VICODIN) 5-325 MG tablet  Every 6 hours PRN     01/31/18 1518    ibuprofen (ADVIL,MOTRIN) 600 MG tablet  Every 6 hours PRN     01/31/18 1518    docusate sodium (COLACE) 250 MG capsule  Daily     01/31/18 1518          Emi HolesLaw, Shanen Norris M, PA-C 01/31/18 1522    Tilden Fossaees, Elizabeth, MD 02/01/18 1556

## 2018-01-31 NOTE — Discharge Instructions (Signed)
Medications: Norco, ibuprofen, Colace  Treatment: Take ibuprofen as prescribed.  For severe pain, take 1-2 Norco every 6 hours.  Take Colace as prescribed to help with bowel movements.  Use ice alternating 20 minutes on, 20 minutes off.  You can also do warm soaks.  You can use a doughnut pillow to help sitting to be more comfortable.  Follow-up: Please follow-up and establish care with a primary care provider by calling the number circled on your discharge paperwork.  Please return to the emergency department if you develop any new or worsening symptoms.

## 2018-01-31 NOTE — ED Triage Notes (Signed)
Pt states she fell while climbing on rocks and fell onto her tail bone and is having [ain in her tail bone. Pt reports pain with sitting and walking. Pt denies any LOC when she fell.

## 2018-11-20 ENCOUNTER — Emergency Department (HOSPITAL_COMMUNITY)
Admission: EM | Admit: 2018-11-20 | Discharge: 2018-11-20 | Disposition: A | Discharge status: Home / Self Care | Payer: Self-pay | Attending: Emergency Medicine | Admitting: Emergency Medicine

## 2018-11-20 ENCOUNTER — Encounter (HOSPITAL_COMMUNITY): Payer: Self-pay | Admitting: Emergency Medicine

## 2018-11-20 ENCOUNTER — Other Ambulatory Visit: Payer: Self-pay

## 2018-11-20 DIAGNOSIS — J209 Acute bronchitis, unspecified: Secondary | ICD-10-CM | POA: Insufficient documentation

## 2018-11-20 DIAGNOSIS — J45909 Unspecified asthma, uncomplicated: Secondary | ICD-10-CM | POA: Insufficient documentation

## 2018-11-20 DIAGNOSIS — F17210 Nicotine dependence, cigarettes, uncomplicated: Secondary | ICD-10-CM | POA: Insufficient documentation

## 2018-11-20 DIAGNOSIS — Z79899 Other long term (current) drug therapy: Secondary | ICD-10-CM | POA: Insufficient documentation

## 2018-11-20 MED ORDER — PREDNISONE 20 MG PO TABS: 40.0000 mg | ORAL_TABLET | Freq: Every day | ORAL | 0 refills | Status: DC

## 2018-11-20 MED ORDER — PREDNISONE 20 MG PO TABS
60.0000 mg | ORAL_TABLET | Freq: Once | ORAL | Status: AC
  Administered 2018-11-20: 60 mg via ORAL
  Filled 2018-11-20: qty 3

## 2018-11-20 MED ORDER — ALBUTEROL SULFATE HFA 108 (90 BASE) MCG/ACT IN AERS
1.0000 | INHALATION_SPRAY | Freq: Once | RESPIRATORY_TRACT | Status: AC
  Administered 2018-11-20: 2 via RESPIRATORY_TRACT
  Filled 2018-11-20: qty 6.7

## 2018-11-20 MED ORDER — PROMETHAZINE-DM 6.25-15 MG/5ML PO SYRP: 5.0000 mL | ORAL_SOLUTION | Freq: Every evening | ORAL | 0 refills | Status: DC | PRN

## 2018-11-20 NOTE — ED Triage Notes (Signed)
PT reports HA , sore throat and nausea since Friday. Pt reports fever 101 on  Friday night . Pt went to work SAT and was sent home after she was coughing  At work . Pt denies fever since Friday.

## 2018-11-20 NOTE — ED Triage Notes (Signed)
Pt c/o nasal congestion and cough with yellow sputum x 4 days. Denies fever, no recent travel, has not been around anyone who recently traveled or is sick.

## 2018-11-20 NOTE — ED Provider Notes (Signed)
[facility] EMERGENCY DEPARTMENT Provider Note   CSN: [id number] Arrival date & time: 11/20/18  1800    History   Chief Complaint Chief Complaint  Patient presents with  . Cough  . Nasal Congestion    HPI Joyce Klein is a 33 y.o. female who presents with URI symptoms and a cough.  Past medical history significant for asthma, bipolar disorder, schizophrenia, frequent headaches, smoking.  The patient states that on Friday she started to have a productive cough.  She had a fever of 101 that night but has not had a fever since then.  She reports associated headache, runny nose, congestion, throat irritation, generalized weakness and decreased appetite.  She has been taking Mucinex which has helped somewhat but the cough is severe, especially at night and she is been having some difficulty sleeping.  She denies any knownsick contacts.  She has not traveled recently.  She denies any chest pain.  She has some shortness of breath when she has really bad coughing spells but otherwise does not feel short of breath.  She has had to use inhalers in the past for bronchitis.     HPI  Past Medical History:  Diagnosis Date  . Asthma   . Bipolar 1 disorder (HCC)   . Blood transfusion without reported diagnosis 2005   PPH  . Depression   . Headache(784.0)   . Migraine   . Schizophrenia (HCC)   . Shortness of breath     Patient Active Problem List   Diagnosis Date Noted  . Dental implant pain 09/05/2015  . Bipolar affect, depressed (HCC) 03/14/2012  . History of suicide attempt 03/14/2012  . Drug abuse (HCC) 03/14/2012  . Obesity 03/14/2012  . Smoker 03/14/2012  . H/O tubal ligation 03/14/2012  . Migraines 03/14/2012  . GERD (gastroesophageal reflux disease) 03/14/2012  . Gallstones 03/14/2012    Past Surgical History:  Procedure Laterality Date  . TUBAL LIGATION    . TUBAL LIGATION       OB History    Gravida  14   Para  2   Term  2   Preterm      AB  12   Living  2     SAB  12   TAB      Ectopic      Multiple      Live Births  2            Home Medications    Prior to Admission medications   Medication Sig Start Date End Date Taking? Authorizing Provider  acetaminophen (TYLENOL) 325 MG tablet Take 975 mg by mouth every 6 (six) hours as needed for headache.    [provider]  albuterol (PROVENTIL HFA;VENTOLIN HFA) 108 (90 BASE) MCG/ACT inhaler Inhale 2 puffs into the lungs every 6 (six) hours as needed. For shortness of breath    [provider]  aspirin-acetaminophen-caffeine (EXCEDRIN MIGRAINE) [phone] MG per tablet Take 1 tablet by mouth every 6 (six) hours as needed for headache.     [provider]  citalopram (CELEXA) 20 MG tablet Take 20 mg by mouth 2 (two) times daily.    [provider]  docusate sodium (COLACE) 250 MG capsule Take 1 capsule (250 mg total) by mouth daily. 01/31/18   Law, Waylan Boga, PA-C  gabapentin (NEURONTIN) 300 MG capsule Take 300 mg by mouth 2 (two) times daily.    [provider]  HYDROcodone-acetaminophen (NORCO/VICODIN) 5-325 MG tablet Take  1-2 tablets by mouth every 6 (six) hours as needed. 01/31/18   Law, Waylan BogaAlexandra M, PA-C  ibuprofen (ADVIL,MOTRIN) 600 MG tablet Take 1 tablet (600 mg total) by mouth every 6 (six) hours as needed. 01/31/18   Law, Waylan BogaAlexandra M, PA-C  methocarbamol (ROBAXIN) 500 MG tablet Take 1 tablet (500 mg total) by mouth 2 (two) times daily as needed for muscle spasms. 05/13/15   Everlene Farrieransie, William, PA-C  naproxen (NAPROSYN) 250 MG tablet Take 1 tablet (250 mg total) by mouth 2 (two) times daily with a meal. Patient not taking: Reported on 08/10/2016 05/13/15   Everlene Farrieransie, William, PA-C  ondansetron (ZOFRAN) 4 MG tablet Take 1 tablet (4 mg total) by mouth every 6 (six) hours. Patient not taking: Reported on 08/10/2016 09/05/15   Roxy HorsemanBrowning, Robert, PA-C  predniSONE (DELTASONE) 20 MG tablet Take 2 tablets (40 mg total) by mouth daily.  11/20/18   Bethel BornGekas, Larz Mark Marie, PA-C  promethazine-dextromethorphan (PROMETHAZINE-DM) 6.25-15 MG/5ML syrup Take 5 mLs by mouth at bedtime as needed for cough. 11/20/18   Bethel BornGekas, Adarrius Graeff Marie, PA-C  topiramate (TOPAMAX) 25 MG tablet Take 25 mg by mouth daily as needed. For headache per patient    [provider]  valACYclovir (VALTREX) 500 MG tablet Take 500 mg by mouth daily as needed.    [provider]    Family History Family History  Problem Relation Age of Onset  . Irritable bowel syndrome Mother   . Other Neg Hx     Social History Social History   Tobacco Use  . Smoking status: Current Every Day Smoker    Packs/day: 0.50    Years: 14.00    Pack years: 7.00    Types: Cigarettes  . Smokeless tobacco: Never Used  Substance Use Topics  . Alcohol use: Yes    Comment: occasioanal  . Drug use: Yes    Types: Marijuana    Comment: last used 04/24/13     Allergies   Bee venom; Coconut oil; and Other   Review of Systems Review of Systems  Constitutional: Positive for fever (resolved).  HENT: Positive for congestion, rhinorrhea and sore throat. Negative for ear pain.   Respiratory: Positive for cough and shortness of breath (with coughing).   Cardiovascular: Negative for chest pain.     Physical Exam Updated Vital Signs BP 95/62   Pulse 74   Temp 97.6 F (36.4 C) (Oral)   Resp 19   LMP 11/19/2018   SpO2 100%   Physical Exam Vitals signs and nursing note reviewed.  Constitutional:      General: She is not in acute distress.    Appearance: Normal appearance. She is well-developed. She is not ill-appearing.     Comments: Calm and cooperative. Mildly ill appearing and fatigued  HENT:     Head: Normocephalic and atraumatic.     Right Ear: Tympanic membrane normal.     Left Ear: Tympanic membrane normal.     Nose: Nose normal.     Mouth/Throat:     Mouth: Mucous membranes are moist.     Comments: Poor dentition Eyes:     General: No scleral  icterus.       Right eye: No discharge.        Left eye: No discharge.     Conjunctiva/sclera: Conjunctivae normal.     Pupils: Pupils are equal, round, and reactive to light.  Neck:     Musculoskeletal: Normal range of motion.  Cardiovascular:     Rate and  Rhythm: Normal rate and regular rhythm.  Pulmonary:     Effort: Pulmonary effort is normal. No respiratory distress.     Breath sounds: Wheezing (end expiratory wheezes) present.  Abdominal:     General: There is no distension.  Skin:    General: Skin is warm and dry.  Neurological:     Mental Status: She is alert and oriented to person, place, and time.  Psychiatric:        Behavior: Behavior normal.      ED Treatments / Results  Labs (all labs ordered are listed, but only abnormal results are displayed) Labs Reviewed - No data to display  EKG None  Radiology No results found.  Procedures Procedures (including critical care time)  Medications Ordered in ED Medications  predniSONE (DELTASONE) tablet 60 mg (60 mg Oral Given 11/20/18 1933)  albuterol (PROVENTIL HFA;VENTOLIN HFA) 108 (90 Base) MCG/ACT inhaler 1-2 puff (2 puffs Inhalation Given 11/20/18 1933)     Initial Impression / Assessment and Plan / ED Course  I have reviewed the triage vital signs and the nursing notes.  Pertinent labs & imaging results that were available during my care of the patient were reviewed by me and considered in my medical decision making (see chart for details).  33 year old female with URI symptoms and coughing.  Her vital signs are normal here.  Exam is overall consistent with bronchitis.  Doubt flu or pneumonia.  Also doubt coded as she has had no known sick contacts or travel.  She has had bronchitis in the past.  Will treat with albuterol, prednisone, and give her Promethazine DM for cough suppression.  She was given a work note and advised to return if worsening.  Final Clinical Impressions(s) / ED Diagnoses   Final  diagnoses:  Acute bronchitis, unspecified organism    ED Discharge Orders         Ordered    promethazine-dextromethorphan (PROMETHAZINE-DM) 6.25-15 MG/5ML syrup  At bedtime PRN     11/20/18 1933    predniSONE (DELTASONE) 20 MG tablet  Daily     11/20/18 1933           Bethel Born, PA-C 11/20/18 1941    Raeford Razor, MD 11/21/18 [phone]

## 2018-11-20 NOTE — Discharge Instructions (Signed)
Rest and drink plenty of fluids Use inhaler as needed for shortness of breath or wheezing Take Prednisone for the next 4 days Take Promethazine as needed for nausea and cough Continue Mucinex Return if worsening

## 2018-11-20 NOTE — ED Notes (Signed)
Patient verbalizes understanding of medications and discharge instructions. No further questions at this time. VSS and patient ambulatory at discharge.   

## 2019-01-06 ENCOUNTER — Other Ambulatory Visit: Payer: Self-pay

## 2019-01-06 ENCOUNTER — Encounter (HOSPITAL_COMMUNITY): Payer: Self-pay | Admitting: *Deleted

## 2019-01-06 ENCOUNTER — Emergency Department (HOSPITAL_COMMUNITY)
Admission: EM | Admit: 2019-01-06 | Discharge: 2019-01-06 | Disposition: A | Discharge status: Home / Self Care | Payer: Self-pay | Attending: Emergency Medicine | Admitting: Emergency Medicine

## 2019-01-06 DIAGNOSIS — Z79899 Other long term (current) drug therapy: Secondary | ICD-10-CM | POA: Insufficient documentation

## 2019-01-06 DIAGNOSIS — J45909 Unspecified asthma, uncomplicated: Secondary | ICD-10-CM | POA: Insufficient documentation

## 2019-01-06 DIAGNOSIS — F17210 Nicotine dependence, cigarettes, uncomplicated: Secondary | ICD-10-CM | POA: Insufficient documentation

## 2019-01-06 DIAGNOSIS — N9089 Other specified noninflammatory disorders of vulva and perineum: Secondary | ICD-10-CM | POA: Insufficient documentation

## 2019-01-06 LAB — POC URINE PREG, ED: Preg Test, Ur: NEGATIVE

## 2019-01-06 MED ORDER — LIDOCAINE HCL URETHRAL/MUCOSAL 2 % EX GEL
1.0000 "application " | Freq: Once | CUTANEOUS | Status: AC
  Administered 2019-01-06: 1 via TOPICAL
  Filled 2019-01-06: qty 20

## 2019-01-06 MED ORDER — VALACYCLOVIR HCL 1 G PO TABS: 1000.0000 mg | ORAL_TABLET | Freq: Two times a day (BID) | ORAL | 0 refills | Status: AC

## 2019-01-06 MED ORDER — MUPIROCIN CALCIUM 2 % NA OINT: TOPICAL_OINTMENT | NASAL | 0 refills | Status: DC

## 2019-01-06 NOTE — Discharge Instructions (Signed)
You are prescribed medication called valacyclovir.  Please take this twice a day for 7 days.  Please also apply the mupirocin ointment 3 times a day for 7 days.  For pain, you can apply the lidocaine jelly in between the mupirocin applications.  Please continue to do the warm soaks in a tub to promote drainage from the wound.  Come back to the emergency department if it is significantly more red, swollen, or there is a large area of drainage.  Please follow-up with the women's clinic as needed for further vaginal concerns.  We tested you with an HSV culture.  This will be available in the next 7 days.  Please establish a my chart so we can send you messages about the results.  Thank you for allowing [place] to participate in your care today.

## 2019-01-06 NOTE — ED Provider Notes (Signed)
[facility] EMERGENCY DEPARTMENT Provider Note   CSN: [id number] Arrival date & time: 01/06/19  1042    History   Chief Complaint Chief Complaint  Patient presents with  . Recurrent Skin Infections    HPI Joyce Klein is a 33 y.o. female.     HPI  Patient is a 33 year old female the past medical history of bipolar 1 disorder, asthma, headaches presenting for a skin lesion just lateral to the left labia majora.  Patient reports that she first noticed it last night.  She described it as a blister that when picked at drained "yellow and green".  She thinks it may have been a blister filled with fluid before it was picked at.  She describes that there were a couple other spots around it but they did not develop blistering.  Patient denies any history of recurrent lesions.  She denies any vaginal discharge.  She denies any history of genital herpes, but thinks she could have had an exposure years ago.  Patient reports that she tried a warm water bath.  Past Medical History:  Diagnosis Date  . Asthma   . Bipolar 1 disorder (HCC)   . Blood transfusion without reported diagnosis 2005   PPH  . Depression   . Headache(784.0)   . Migraine   . Schizophrenia (HCC)   . Shortness of breath     Patient Active Problem List   Diagnosis Date Noted  . Dental implant pain 09/05/2015  . Bipolar affect, depressed (HCC) 03/14/2012  . History of suicide attempt 03/14/2012  . Drug abuse (HCC) 03/14/2012  . Obesity 03/14/2012  . Smoker 03/14/2012  . H/O tubal ligation 03/14/2012  . Migraines 03/14/2012  . GERD (gastroesophageal reflux disease) 03/14/2012  . Gallstones 03/14/2012    Past Surgical History:  Procedure Laterality Date  . TUBAL LIGATION    . TUBAL LIGATION       OB History    Gravida  14   Para  2   Term  2   Preterm      AB  12   Living  2     SAB  12   TAB      Ectopic      Multiple      Live Births  2            Home  Medications    Prior to Admission medications   Medication Sig Start Date End Date Taking? Authorizing Provider  acetaminophen (TYLENOL) 325 MG tablet Take 975 mg by mouth every 6 (six) hours as needed for headache.    [provider]  albuterol (PROVENTIL HFA;VENTOLIN HFA) 108 (90 BASE) MCG/ACT inhaler Inhale 2 puffs into the lungs every 6 (six) hours as needed. For shortness of breath    [provider]  aspirin-acetaminophen-caffeine (EXCEDRIN MIGRAINE) [phone] MG per tablet Take 1 tablet by mouth every 6 (six) hours as needed for headache.     [provider]  citalopram (CELEXA) 20 MG tablet Take 20 mg by mouth 2 (two) times daily.    [provider]  docusate sodium (COLACE) 250 MG capsule Take 1 capsule (250 mg total) by mouth daily. 01/31/18   Law, Waylan Boga, PA-C  gabapentin (NEURONTIN) 300 MG capsule Take 300 mg by mouth 2 (two) times daily.    [provider]  HYDROcodone-acetaminophen (NORCO/VICODIN) 5-325 MG tablet Take 1-2 tablets by mouth every 6 (six) hours as needed. 01/31/18   Emi Holes,  PA-C  ibuprofen (ADVIL,MOTRIN) 600 MG tablet Take 1 tablet (600 mg total) by mouth every 6 (six) hours as needed. 01/31/18   Law, Waylan Boga, PA-C  methocarbamol (ROBAXIN) 500 MG tablet Take 1 tablet (500 mg total) by mouth 2 (two) times daily as needed for muscle spasms. 05/13/15   Everlene Farrier, PA-C  mupirocin nasal ointment (BACTROBAN) 2 % Apply to the lesions 3 times a day for 7 days. 01/06/19   Aviva Kluver B, PA-C  naproxen (NAPROSYN) 250 MG tablet Take 1 tablet (250 mg total) by mouth 2 (two) times daily with a meal. Patient not taking: Reported on 08/10/2016 05/13/15   Everlene Farrier, PA-C  ondansetron (ZOFRAN) 4 MG tablet Take 1 tablet (4 mg total) by mouth every 6 (six) hours. Patient not taking: Reported on 08/10/2016 09/05/15   Roxy Horseman, PA-C  predniSONE (DELTASONE) 20 MG tablet Take 2 tablets (40 mg total) by mouth daily.  11/20/18   Bethel Born, PA-C  promethazine-dextromethorphan (PROMETHAZINE-DM) 6.25-15 MG/5ML syrup Take 5 mLs by mouth at bedtime as needed for cough. 11/20/18   Bethel Born, PA-C  topiramate (TOPAMAX) 25 MG tablet Take 25 mg by mouth daily as needed. For headache per patient    [provider]  valACYclovir (VALTREX) 1000 MG tablet Take 1 tablet (1,000 mg total) by mouth 2 (two) times daily for 7 days. 01/06/19 01/13/19  Elisha Ponder, PA-C    Family History Family History  Problem Relation Age of Onset  . Irritable bowel syndrome Mother   . Other Neg Hx     Social History Social History   Tobacco Use  . Smoking status: Current Every Day Smoker    Packs/day: 0.50    Years: 14.00    Pack years: 7.00    Types: Cigarettes  . Smokeless tobacco: Never Used  Substance Use Topics  . Alcohol use: Yes    Comment: occasioanal  . Drug use: Yes    Types: Marijuana    Comment: last used 04/24/13     Allergies   Bee venom; Coconut oil; and Other   Review of Systems Review of Systems  Constitutional: Negative for chills and fever.  Genitourinary: Positive for vaginal pain. Negative for dysuria, pelvic pain, vaginal bleeding and vaginal discharge.  Skin: Positive for color change and rash.  All other systems reviewed and are negative.    Physical Exam Updated Vital Signs BP 111/67   Pulse 92   Temp 97.7 F (36.5 C) (Oral)   Resp 16   Ht 5' 3.5" (1.613 m)   Wt 55.3 kg   LMP 01/06/2019   SpO2 100%   BMI 21.27 kg/m   Physical Exam Vitals signs and nursing note reviewed.  Constitutional:      General: She is not in acute distress.    Appearance: She is well-developed. She is not diaphoretic.     Comments: Sitting comfortably in bed.  HENT:     Head: Normocephalic and atraumatic.  Eyes:     General:        Right eye: No discharge.        Left eye: No discharge.     Conjunctiva/sclera: Conjunctivae normal.     Comments: EOMs normal to gross  examination.  Neck:     Musculoskeletal: Normal range of motion.  Cardiovascular:     Rate and Rhythm: Normal rate and regular rhythm.     Comments: Intact, 2+ radial pulse. Pulmonary:     Comments: Converses  comfortably. No audible wheeze or stridor.  Abdominal:     General: Abdomen is flat. There is no distension.  Genitourinary:      Comments: Pelvic examination performed with nurse tech chaperone present.  Patient has a 1 cm crusted over erythematous lesion just lateral to the left labia majora.  There is no fluctuance or purulent drainage.  No surrounding erythema or induration.  Small amount of serous fluid draining from the scab. Musculoskeletal: Normal range of motion.  Skin:    General: Skin is warm and dry.  Neurological:     Mental Status: She is alert.     Comments: Cranial nerves intact to gross observation. Patient moves extremities without difficulty.  Psychiatric:        Behavior: Behavior normal.        Thought Content: Thought content normal.        Judgment: Judgment normal.      ED Treatments / Results  Labs (all labs ordered are listed, but only abnormal results are displayed) Labs Reviewed  HSV CULTURE AND TYPING  POC URINE PREG, ED    EKG None  Radiology No results found.  Procedures Procedures (including critical care time)  Medications Ordered in ED Medications  lidocaine (XYLOCAINE) 2 % jelly 1 application (1 application Topical Given 01/06/19 1130)     Initial Impression / Assessment and Plan / ED Course  I have reviewed the triage vital signs and the nursing notes.  Pertinent labs & imaging results that were available during my care of the patient were reviewed by me and considered in my medical decision making (see chart for details).        Patient is well-appearing and in no acute distress.  There is no abscess or lesions at the introitus suggestive of Bartholin's abscess.  Lesion is on the periphery of the perineum.  Appears  to have adequately drained and there is no fluctuance, erythema or induration.  Differential diagnosis includes folliculitis versus herpes simplex lesions that developed secondary bacterial infection.  Given the well appearance of these lesions, do not feel that patient requires oral antibiotics.  Given that they are suspicious appearing for herpes, they were swabbed for HSV culture and typing, and will prescribe patient Valtrex as well as mupirocin for possible folliculitis superimposed.  Patient also given topical lidocaine jelly for discomfort.  She was instructed to follow-up with the women's clinic as needed.  Return precautions given for any increasing pain, swelling, or drainage.  Patient is in understanding and agrees with the plan of care.  Final Clinical Impressions(s) / ED Diagnoses   Final diagnoses:  Lesion of labia    ED Discharge Orders         Ordered    valACYclovir (VALTREX) 1000 MG tablet  2 times daily     01/06/19 1143    mupirocin nasal ointment (BACTROBAN) 2 %     01/06/19 1143           Elisha PonderMurray, Andriea Hasegawa B, PA-C 01/06/19 1157    Blane OharaZavitz, Joshua, MD 01/06/19 1558

## 2019-01-06 NOTE — ED Notes (Signed)
Patient verbalizes understanding of discharge instructions . Opportunity for questions and answers were provided . Armband removed by staff ,Pt discharged from ED. W/C  offered at D/C  and Declined W/C at D/C and was escorted to lobby by RN.  

## 2019-01-06 NOTE — ED Triage Notes (Signed)
PT reports last night finding an abscess on external labia. Pain 6/10. Pt reporfts there has been yellow to green drainage, Temp 97.7

## 2019-01-09 LAB — HSV CULTURE AND TYPING

## 2019-02-05 ENCOUNTER — Emergency Department (HOSPITAL_COMMUNITY)
Admission: EM | Admit: 2019-02-05 | Discharge: 2019-02-05 | Disposition: A | Discharge status: Home / Self Care | Payer: Self-pay | Attending: Emergency Medicine | Admitting: Emergency Medicine

## 2019-02-05 ENCOUNTER — Encounter (HOSPITAL_COMMUNITY): Payer: Self-pay | Admitting: Emergency Medicine

## 2019-02-05 ENCOUNTER — Other Ambulatory Visit: Payer: Self-pay

## 2019-02-05 DIAGNOSIS — F17210 Nicotine dependence, cigarettes, uncomplicated: Secondary | ICD-10-CM | POA: Insufficient documentation

## 2019-02-05 DIAGNOSIS — F250 Schizoaffective disorder, bipolar type: Secondary | ICD-10-CM | POA: Insufficient documentation

## 2019-02-05 DIAGNOSIS — L03012 Cellulitis of left finger: Secondary | ICD-10-CM

## 2019-02-05 DIAGNOSIS — Z23 Encounter for immunization: Secondary | ICD-10-CM | POA: Insufficient documentation

## 2019-02-05 MED ORDER — LIDOCAINE HCL 2 % IJ SOLN
10.0000 mL | Freq: Once | INTRAMUSCULAR | Status: AC
  Administered 2019-02-05: 200 mg
  Filled 2019-02-05: qty 20

## 2019-02-05 MED ORDER — SULFAMETHOXAZOLE-TRIMETHOPRIM 800-160 MG PO TABS: 1.0000 | ORAL_TABLET | Freq: Two times a day (BID) | ORAL | 0 refills | Status: AC

## 2019-02-05 MED ORDER — SULFAMETHOXAZOLE-TRIMETHOPRIM 800-160 MG PO TABS
1.0000 | ORAL_TABLET | Freq: Once | ORAL | Status: AC
  Administered 2019-02-05: 1 via ORAL
  Filled 2019-02-05: qty 1

## 2019-02-05 MED ORDER — CEPHALEXIN 500 MG PO CAPS: 500.0000 mg | ORAL_CAPSULE | Freq: Four times a day (QID) | ORAL | 0 refills | Status: AC

## 2019-02-05 MED ORDER — TETANUS-DIPHTH-ACELL PERTUSSIS 5-2.5-18.5 LF-MCG/0.5 IM SUSP
0.5000 mL | Freq: Once | INTRAMUSCULAR | Status: AC
  Administered 2019-02-05: 0.5 mL via INTRAMUSCULAR
  Filled 2019-02-05: qty 0.5

## 2019-02-05 MED ORDER — CEPHALEXIN 250 MG PO CAPS
500.0000 mg | ORAL_CAPSULE | Freq: Once | ORAL | Status: AC
  Administered 2019-02-05: 500 mg via ORAL
  Filled 2019-02-05: qty 2

## 2019-02-05 NOTE — ED Triage Notes (Signed)
Pt. Stated, daughter was cutting my nails and cut them too short.

## 2019-02-05 NOTE — ED Provider Notes (Addendum)
[facility] EMERGENCY DEPARTMENT Provider Note   CSN: [id number] Arrival date & time: 02/05/19  1234    History   Chief Complaint Chief Complaint  Patient presents with  . Finger Injury    fingers    HPI Joyce Klein is a 33 y.o. female presenting for evaluation of finger pain.  Patient states last week her 85 year old daughter cut her fingernails, and cut on the sides to sure.  She reports gradually worsening pain of her index and ring finger of her left hand.  She tried to drain her index finger today without improvement of symptoms.  Is been taking Tylenol and ibuprofen without improvement in symptoms.  She reports worsening swelling and redness.  Pain is not stated down her hand.  She denies fevers, chills, numbness.  She denies cuts elsewhere.  She does not know when her last tetanus shot was.  She has no medical problems, takes medications daily.  She is not on blood thinners.  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      HPI  Past Medical History:  Diagnosis Date  . Asthma   . Bipolar 1 disorder ([place])   . Blood transfusion without reported diagnosis 2005   [place]  . Depression   . Headache(784.0)   . Migraine   . Schizophrenia ([place])   . Shortness of breath     Patient Active Problem List   Diagnosis Date Noted  . Dental implant pain 09/05/2015  . Bipolar affect, depressed ([place]) 03/14/2012  . History of suicide attempt 03/14/2012  . Drug abuse ([place]) 03/14/2012  . Obesity 03/14/2012  . Smoker 03/14/2012  . H/O tubal ligation 03/14/2012  . Migraines 03/14/2012  . GERD (gastroesophageal reflux disease) 03/14/2012  . Gallstones 03/14/2012    Past Surgical History:  Procedure Laterality Date  . TUBAL LIGATION    . TUBAL LIGATION       OB History    Gravida  14   Para  2   Term  2   Preterm      AB  12   Living  2     SAB  12   TAB      Ectopic      Multiple      Live Births  2            Home Medications    Prior to Admission medications   Medication Sig Start Date End Date Taking? Authorizing Provider  acetaminophen (TYLENOL) 500 MG tablet Take 1,500 mg by mouth every 6 (six) hours as needed for headache.    Yes [provider]  albuterol (PROVENTIL HFA;VENTOLIN HFA) 108 (90 BASE) MCG/ACT inhaler Inhale 2 puffs into the lungs every 6 (six) hours as needed. For shortness of breath   Yes [provider]  aspirin-acetaminophen-caffeine (EXCEDRIN MIGRAINE) [phone] MG per tablet Take 1 tablet by mouth every 6 (six) hours as needed for headache.    Yes [provider]  cephALEXin (KEFLEX) 500 MG capsule Take 1 capsule (500 mg total) by mouth 4 (four) times daily for 7 days. 02/05/19 02/12/19  Jayce Kainz, PA-C  sulfamethoxazole-trimethoprim (BACTRIM DS) 800-160 MG tablet Take 1 tablet by mouth 2 (two) times daily for 7 days. 02/05/19 02/12/19  Rayansh Herbst, PA-C    Family History Family History  Problem Relation Age of Onset  . Irritable bowel syndrome Mother   . Other Neg Hx  Social History Social History   Tobacco Use  . Smoking status: Current Every Day Smoker    Packs/day: 0.50    Years: 14.00    Pack years: 7.00    Types: Cigarettes  . Smokeless tobacco: Never Used  Substance Use Topics  . Alcohol use: Yes    Comment: occasioanal  . Drug use: Yes    Types: Marijuana    Comment: last used 04/24/13     Allergies   Bee venom; Coconut oil; and Other   Review of Systems Review of Systems  Constitutional: Negative for fever.  Skin: Positive for color change.       Pain and swelling of index and ring finger of the left hand     Physical Exam Updated Vital Signs BP 105/62 (BP Location: Right Arm)   Pulse 92   Temp 98.1 F (36.7 C) (Oral)   Resp 17   LMP 01/06/2019   SpO2 100%   Physical Exam Vitals signs and nursing note reviewed.  Constitutional:      General: She is not in  acute distress.    Appearance: She is well-developed.  HENT:     Head: Normocephalic and atraumatic.  Neck:     Musculoskeletal: Normal range of motion.  Pulmonary:     Effort: Pulmonary effort is normal.  Abdominal:     General: There is no distension.  Musculoskeletal: Normal range of motion.  Skin:    General: Skin is warm.     Capillary Refill: Capillary refill takes less than 2 seconds.     Findings: No rash.     Comments: Erythema and swelling with an area of fluctuance around the nail of the left index finger.  Tenderness palpation of the left index finger.  Decreased range of motion due to swelling.  Distal cap refill and sensation intact. Erythema and tenderness surrounding the nail of the left ring finger without area of fluctuance.  Minimal swelling.  Good distal cap refill and sensation. No signs of infection surrounding the nails of any other finger.  Neurological:     Mental Status: She is alert and oriented to person, place, and time.      ED Treatments / Results  Labs (all labs ordered are listed, but only abnormal results are displayed) Labs Reviewed - No data to display  EKG None  Radiology No results found.  Procedures .Marland Kitchen.Incision and Drainage Performed by: Alveria Apleyaccavale, [facility], PA-C Authorized by: Alveria Apleyaccavale, Eli Adami, PA-C   Drain paronychia Date/Time: 02/05/2019 3:26 PM Performed by: Alveria Apleyaccavale, Shykeem Resurreccion, PA-C Authorized by: Alveria Apleyaccavale, Mainor Hellmann, PA-C  Consent: Verbal consent obtained. Risks and benefits: risks, benefits and alternatives were discussed Consent given by: patient Patient understanding: patient states understanding of the procedure being performed Patient consent: the patient's understanding of the procedure matches consent given Procedure consent: procedure consent matches procedure scheduled Relevant documents: relevant documents present and verified Patient identity confirmed: verbally with patient Preparation: Patient was prepped and draped  in the usual sterile fashion. Local anesthesia used: yes Anesthesia: digital block  Anesthesia: Local anesthesia used: yes Local Anesthetic: lidocaine 2% without epinephrine Anesthetic total: 6 mL  Sedation: Patient sedated: no  Patient tolerance: Patient tolerated the procedure well with no immediate complications Comments: Digital block performed using tendon sheath method.  Complete anesthesia achieved.  Paronychia drained using 11 blade scalpel and irrigation with water.  Bulky dressing placed.  Patient tolerated well.    (including critical care time)  Medications Ordered in ED Medications  Tdap (BOOSTRIX) injection  0.5 mL (0.5 mLs Intramuscular Given 02/05/19 1417)  lidocaine (XYLOCAINE) 2 % (with pres) injection 200 mg (200 mg Other Given by Other 02/05/19 1430)  cephALEXin (KEFLEX) capsule 500 mg (500 mg Oral Given 02/05/19 1510)  sulfamethoxazole-trimethoprim (BACTRIM DS) 800-160 MG per tablet 1 tablet (1 tablet Oral Given 02/05/19 1510)     Initial Impression / Assessment and Plan / ED Course  I have reviewed the triage vital signs and the nursing notes.  Pertinent labs & imaging results that were available during my care of the patient were reviewed by me and considered in my medical decision making (see chart for details).        Patient presenting for evaluation of finger pain.  Physical exam reassuring, she appears nontoxic.  Patient with paronychia of the left index finger and beginnings of infection surrounding the nail of the left ring finger.  Paronychia was drained as described above.  Discussed aftercare instructions including taking antibiotics, washing the area, and pain control.  Encourage patient to monitor for signs of worsening infection.  At this time, patient appears safe for discharge.  Return precautions given.  Patient states she understands and agrees to plan.   Final Clinical Impressions(s) / ED Diagnoses   Final diagnoses:  Paronychia of left index  finger    ED Discharge Orders         Ordered    cephALEXin (KEFLEX) 500 MG capsule  4 times daily     02/05/19 1519    sulfamethoxazole-trimethoprim (BACTRIM DS) 800-160 MG tablet  2 times daily     02/05/19 1519           Chyrel Taha, PA-C 02/05/19 1528    Alveria ApleyCaccavale, Angeliz Settlemyre, PA-C 02/05/19 1530    Wynetta FinesMessick, Peter C, MD 02/06/19 1542

## 2019-02-05 NOTE — ED Notes (Signed)
AVS and prescription x 2 given.  No further questions.

## 2019-02-05 NOTE — Discharge Instructions (Addendum)
Take antibiotics as prescribed.  Take entire course, even if your symptoms improve. Wash your finger with soap and water tomorrow, and apply a new dressing.  Do this daily until it heals. Use Tylenol and ibuprofen as needed for pain.  Keep your finger elevated.  Apply ice to help with pain and swelling. Return to the emergency room if you develop high fevers, worsening pus from the area, or worsening pain after being on antibiotics for 2 to 3 days. Return with any new, worsening, or concerning symptoms.

## 2019-09-03 ENCOUNTER — Other Ambulatory Visit: Payer: Self-pay

## 2019-09-03 ENCOUNTER — Encounter (HOSPITAL_BASED_OUTPATIENT_CLINIC_OR_DEPARTMENT_OTHER): Payer: Self-pay | Admitting: *Deleted

## 2019-09-03 ENCOUNTER — Emergency Department (HOSPITAL_BASED_OUTPATIENT_CLINIC_OR_DEPARTMENT_OTHER)
Admission: EM | Admit: 2019-09-03 | Discharge: 2019-09-03 | Disposition: A | Discharge status: Home / Self Care | Payer: Self-pay | Attending: Emergency Medicine | Admitting: Emergency Medicine

## 2019-09-03 DIAGNOSIS — Z5321 Procedure and treatment not carried out due to patient leaving prior to being seen by health care provider: Secondary | ICD-10-CM | POA: Insufficient documentation

## 2019-09-03 DIAGNOSIS — M25521 Pain in right elbow: Secondary | ICD-10-CM | POA: Insufficient documentation

## 2019-09-03 NOTE — ED Triage Notes (Signed)
Pt /o right elbow injury x 1 week ago

## 2020-06-04 ENCOUNTER — Other Ambulatory Visit: Payer: Self-pay

## 2020-06-04 ENCOUNTER — Emergency Department: Payer: Self-pay

## 2020-06-04 ENCOUNTER — Encounter: Payer: Self-pay | Admitting: Emergency Medicine

## 2020-06-04 ENCOUNTER — Emergency Department
Admission: EM | Admit: 2020-06-04 | Discharge: 2020-06-04 | Disposition: A | Discharge status: Home / Self Care | Payer: Self-pay | Attending: Emergency Medicine | Admitting: Emergency Medicine

## 2020-06-04 DIAGNOSIS — H9202 Otalgia, left ear: Secondary | ICD-10-CM | POA: Insufficient documentation

## 2020-06-04 DIAGNOSIS — F17210 Nicotine dependence, cigarettes, uncomplicated: Secondary | ICD-10-CM | POA: Insufficient documentation

## 2020-06-04 DIAGNOSIS — J45909 Unspecified asthma, uncomplicated: Secondary | ICD-10-CM | POA: Insufficient documentation

## 2020-06-04 DIAGNOSIS — M25561 Pain in right knee: Secondary | ICD-10-CM | POA: Insufficient documentation

## 2020-06-04 MED ORDER — NEOMYCIN-POLYMYXIN-HC 3.5-10000-1 OT SOLN: 3.0000 [drp] | Freq: Four times a day (QID) | OTIC | 0 refills | Status: AC

## 2020-06-04 MED ORDER — NAPROXEN 500 MG PO TABS: 500.0000 mg | ORAL_TABLET | Freq: Two times a day (BID) | ORAL | Status: DC

## 2020-06-04 NOTE — ED Provider Notes (Signed)
[facility] Emergency Department Provider Note   ____________________________________________   First MD Initiated Contact with Patient 06/04/20 1244     (approximate)  I have reviewed the triage vital signs and the nursing notes.   HISTORY  Chief Complaint Knee Pain    HPI Joyce Klein is a 34 y.o. female patient complaining of 1 week of right knee pain.  Patient gives a vague history of a previous injury.  Patient said the knee "popped out" last week.  Patient states it "popped back in place".  States continued knee pain since incident.  Only mild relief with using knee brace over-the-counter anti-inflammatory medications.  Rates pain as a 7/10.  Described pain is "achy".         Past Medical History:  Diagnosis Date  . Asthma   . Bipolar 1 disorder ([facility])   . Blood transfusion without reported diagnosis 2005   [place]  . Depression   . Headache(784.0)   . Migraine   . Schizophrenia ([facility])   . Shortness of breath     Patient Active Problem List   Diagnosis Date Noted  . Dental implant pain 09/05/2015  . Bipolar affect, depressed ([facility]) 03/14/2012  . History of suicide attempt 03/14/2012  . Drug abuse ([facility]) 03/14/2012  . Obesity 03/14/2012  . Smoker 03/14/2012  . H/O tubal ligation 03/14/2012  . Migraines 03/14/2012  . GERD (gastroesophageal reflux disease) 03/14/2012  . Gallstones 03/14/2012    Past Surgical History:  Procedure Laterality Date  . TUBAL LIGATION    . TUBAL LIGATION      Prior to Admission medications   Medication Sig Start Date End Date Taking? Authorizing Provider  acetaminophen (TYLENOL) 500 MG tablet Take 1,500 mg by mouth every 6 (six) hours as needed for headache.     [provider]  albuterol (PROVENTIL HFA;VENTOLIN HFA) 108 (90 BASE) MCG/ACT inhaler Inhale 2 puffs into the lungs every 6 (six) hours as needed. For shortness of breath    [provider]  aspirin-acetaminophen-caffeine  (EXCEDRIN MIGRAINE) [phone] MG per tablet Take 1 tablet by mouth every 6 (six) hours as needed for headache.     [provider]  naproxen (NAPROSYN) 500 MG tablet Take 1 tablet (500 mg total) by mouth 2 (two) times daily with a meal. 06/04/20   Joni Reining, PA-C  neomycin-polymyxin-hydrocortisone (CORTISPORIN) OTIC solution Place 3 drops into the left ear 4 (four) times daily for 10 days. 06/04/20 06/14/20  Joni Reining, PA-C    Allergies Bee venom, Coconut oil, and Other  Family History  Problem Relation Age of Onset  . Irritable bowel syndrome Mother   . Other Neg Hx     Social History Social History   Tobacco Use  . Smoking status: Current Every Day Smoker    Packs/day: 0.50    Years: 14.00    Pack years: 7.00    Types: Cigarettes  . Smokeless tobacco: Never Used  Substance Use Topics  . Alcohol use: Yes    Comment: occasioanal  . Drug use: Yes    Types: Marijuana    Comment: last used 04/24/13    Review of Systems  Constitutional: No fever/chills Eyes: No visual changes. ENT: No sore throat. Cardiovascular: Denies chest pain. Respiratory: Denies shortness of breath. Gastrointestinal: No abdominal pain.  No nausea, no vomiting.  No diarrhea.  No constipation. Genitourinary: Negative for dysuria. Musculoskeletal: Right knee pain. Skin: Negative for rash. Neurological: Negative for headaches, focal weakness  or numbness. Psychiatric:  Bipolar depression. Allergic/Immunilogical: Bee venom and coconut oil. ____________________________________________   PHYSICAL EXAM:  VITAL SIGNS: ED Triage Vitals  Enc Vitals Group     BP 06/04/20 1143 110/73     Pulse Rate 06/04/20 1143 94     Resp 06/04/20 1143 17     Temp 06/04/20 1143 98.2 F (36.8 C)     Temp Source 06/04/20 1143 Oral     SpO2 06/04/20 1143 99 %     Weight --      Height --      Head Circumference --      Peak Flow --      Pain Score 06/04/20 1156 7     Pain Loc --      Pain Edu?  --      Excl. in GC? --     Constitutional: Alert and oriented. Well appearing and in no acute distress. EARS: Edematous/erythematous left ear canal. Hematological/Lymphatic/Immunilogical: No cervical lymphadenopathy. Cardiovascular: Normal rate, regular rhythm. Grossly normal heart sounds.  Good peripheral circulation. Respiratory: Normal respiratory effort.  No retractions. Lungs CTAB. Musculoskeletal: No lower extremity tenderness nor edema.  No joint effusions. Neurologic:  Normal speech and language. No gross focal neurologic deficits are appreciated. No gait instability. Skin:  Skin is warm, dry and intact. No rash noted. Psychiatric: Mood and affect are normal. Speech and behavior are normal.  ____________________________________________   LABS (all labs ordered are listed, but only abnormal results are displayed)  Labs Reviewed - No data to display ____________________________________________  EKG   ____________________________________________  RADIOLOGY I, Joni Reining, personally viewed and evaluated these images (plain radiographs) as part of my medical decision making, as well as reviewing the written report by the radiologist.  ED MD interpretation: No acute findings on x-ray of the right knee.  Official radiology report(s): DG Knee Complete 4 Views Right  Result Date: 06/04/2020 CLINICAL DATA:  RIGHT knee pain for 1 week, history of subluxation reduction week ago EXAM: RIGHT KNEE - COMPLETE 4+ VIEW COMPARISON:  None FINDINGS: Osseous mineralization normal. Joint spaces preserved. No fracture, dislocation, or bone destruction. No joint effusion. IMPRESSION: Normal exam. Electronically Signed   By: Ulyses Southward M.D.   On: 06/04/2020 13:07    ____________________________________________   PROCEDURES  Procedure(s) performed (including Critical Care):  Procedures   ____________________________________________   INITIAL IMPRESSION / ASSESSMENT AND PLAN / ED  COURSE  As part of my medical decision making, I reviewed the following data within the electronic MEDICAL RECORD NUMBER        Patient presents with left ear and left knee pain.  Discussed no acute findings on x-ray of the right knee.  Patient complaint physical exam is consistent with otitis external and left knee pain.  Patient given discharge care instruction.  Patient given prescription for naproxen and Cortisporin eardrops.  Patient advised to follow-up with PCP.      ____________________________________________   FINAL CLINICAL IMPRESSION(S) / ED DIAGNOSES  Final diagnoses:  Right knee pain, unspecified chronicity  Left ear pain     ED Discharge Orders         Ordered    naproxen (NAPROSYN) 500 MG tablet  2 times daily with meals        06/04/20 1321    neomycin-polymyxin-hydrocortisone (CORTISPORIN) OTIC solution  4 times daily        06/04/20 1321          *Please note:  Joyce Klein was  evaluated in Emergency Department on 06/04/2020 for the symptoms described in the history of present illness. She was evaluated in the context of the global COVID-19 pandemic, which necessitated consideration that the patient might be at risk for infection with the SARS-CoV-2 virus that causes COVID-19. Institutional protocols and algorithms that pertain to the evaluation of patients at risk for COVID-19 are in a state of rapid change based on information released by regulatory bodies including the CDC and federal and state organizations. These policies and algorithms were followed during the patient's care in the ED.  Some ED evaluations and interventions may be delayed as a result of limited staffing during and the pandemic.*   Note:  This document was prepared using Dragon voice recognition software and may include unintentional dictation errors.    Joni Reining, PA-C 06/04/20 1326    Merwyn Katos, MD 06/04/20 1556

## 2020-06-04 NOTE — ED Triage Notes (Signed)
Pt presents to ED via POV with c/o R knee pain hx of previous injury, pt states popped her knee out of place last week, popped it back in, pt states has been trying to rest and ice her injury without relief of pain. Pt ambulatory with brace to R knee at this time.

## 2020-11-01 ENCOUNTER — Emergency Department: Payer: Self-pay

## 2020-11-01 ENCOUNTER — Emergency Department
Admission: EM | Admit: 2020-11-01 | Discharge: 2020-11-01 | Disposition: A | Discharge status: Home / Self Care | Payer: Self-pay | Attending: Emergency Medicine | Admitting: Emergency Medicine

## 2020-11-01 ENCOUNTER — Other Ambulatory Visit: Payer: Self-pay

## 2020-11-01 ENCOUNTER — Encounter: Payer: Self-pay | Admitting: Emergency Medicine

## 2020-11-01 DIAGNOSIS — F17210 Nicotine dependence, cigarettes, uncomplicated: Secondary | ICD-10-CM | POA: Insufficient documentation

## 2020-11-01 DIAGNOSIS — M79671 Pain in right foot: Secondary | ICD-10-CM

## 2020-11-01 DIAGNOSIS — J45909 Unspecified asthma, uncomplicated: Secondary | ICD-10-CM | POA: Insufficient documentation

## 2020-11-01 DIAGNOSIS — Z7982 Long term (current) use of aspirin: Secondary | ICD-10-CM | POA: Insufficient documentation

## 2020-11-01 DIAGNOSIS — L02611 Cutaneous abscess of right foot: Secondary | ICD-10-CM | POA: Insufficient documentation

## 2020-11-01 MED ORDER — LIDOCAINE HCL (PF) 1 % IJ SOLN
2.0000 mL | Freq: Once | INTRAMUSCULAR | Status: AC
  Administered 2020-11-01: 2 mL
  Filled 2020-11-01: qty 5

## 2020-11-01 MED ORDER — CEPHALEXIN 500 MG PO CAPS: 500.0000 mg | ORAL_CAPSULE | Freq: Three times a day (TID) | ORAL | 0 refills | Status: AC

## 2020-11-01 MED ORDER — BACITRACIN-NEOMYCIN-POLYMYXIN 400-5-5000 EX OINT
TOPICAL_OINTMENT | Freq: Once | CUTANEOUS | Status: AC
  Administered 2020-11-01: 1 via TOPICAL
  Filled 2020-11-01: qty 1

## 2020-11-01 MED ORDER — CEPHALEXIN 500 MG PO CAPS
500.0000 mg | ORAL_CAPSULE | Freq: Once | ORAL | Status: AC
  Administered 2020-11-01: 500 mg via ORAL
  Filled 2020-11-01: qty 1

## 2020-11-01 NOTE — ED Notes (Signed)
Patient reports heel spurs x years. Patient reports reoccurring blisters to right foot r/t heel spurs.

## 2020-11-01 NOTE — ED Triage Notes (Signed)
Pt presents to the ED for R foot pain that started 2 weeks ago. Pt self dx with a heel spur. Pt states that she started noticing blisters 3-4 days ago. No blisters have bursted. Pt woke up today and pt states the pain is worst its ever been. Pt has limited ROM. Pt is A&Ox4 and NAD.

## 2020-11-01 NOTE — Discharge Instructions (Addendum)
You were seen today for right heel pain.  Your x-ray did not show any evidence of a heel spur.  You have an abscess of your right heel that we incised and drained today.  I am giving a prescription for antibiotics to take 3 times daily for the next 7 days.  Monitor for increased pain, redness, swelling or discharge.  Return to the ER if you develop fever greater than 101.5, chills, body aches, nausea or vomiting.

## 2020-11-01 NOTE — ED Provider Notes (Signed)
[facility] Emergency Department Provider Note ____________________________________________  Time seen: 1340  I have reviewed the triage vital signs and the nursing notes.  HISTORY  Chief Complaint  Foot Pain   HPI Joyce Klein is a 35 y.o. female presents to the ER today with complaint of right heel pain.  She reports this started 2 weeks ago.  She describes the pain as sharp and stabbing, worse with ambulation.  She reports some associated numbness and tingling of the right heel.  She denies right knee, hip or back pain.  She denies any injury to the area that she is aware of.  She did notice a pus filled blister of the area last night.  She was able to pop that blister but another pus filled blister has come up in the same area.  She reports these areas are very tender to touch.  She has tried taking Excedrin Migraine OTC with minimal relief of symptoms.  Past Medical History:  Diagnosis Date  . Asthma   . Bipolar 1 disorder (HCC)   . Blood transfusion without reported diagnosis 2005   PPH  . Depression   . Headache(784.0)   . Migraine   . Schizophrenia (HCC)   . Shortness of breath     Patient Active Problem List   Diagnosis Date Noted  . Dental implant pain 09/05/2015  . Bipolar affect, depressed (HCC) 03/14/2012  . History of suicide attempt 03/14/2012  . Drug abuse (HCC) 03/14/2012  . Obesity 03/14/2012  . Smoker 03/14/2012  . H/O tubal ligation 03/14/2012  . Migraines 03/14/2012  . GERD (gastroesophageal reflux disease) 03/14/2012  . Gallstones 03/14/2012    Past Surgical History:  Procedure Laterality Date  . TUBAL LIGATION    . TUBAL LIGATION      Prior to Admission medications   Medication Sig Start Date End Date Taking? Authorizing Provider  cephALEXin (KEFLEX) 500 MG capsule Take 1 capsule (500 mg total) by mouth 3 (three) times daily for 10 days. 11/01/20 11/11/20 Yes Baity, Salvadore Oxford, NP  acetaminophen (TYLENOL) 500 MG tablet  Take 1,500 mg by mouth every 6 (six) hours as needed for headache.     [provider]  albuterol (PROVENTIL HFA;VENTOLIN HFA) 108 (90 BASE) MCG/ACT inhaler Inhale 2 puffs into the lungs every 6 (six) hours as needed. For shortness of breath    [provider]  aspirin-acetaminophen-caffeine (EXCEDRIN MIGRAINE) [phone] MG per tablet Take 1 tablet by mouth every 6 (six) hours as needed for headache.     [provider]  naproxen (NAPROSYN) 500 MG tablet Take 1 tablet (500 mg total) by mouth 2 (two) times daily with a meal. 06/04/20   Joni Reining, PA-C    Allergies Bee venom, Coconut oil, and Other  Family History  Problem Relation Age of Onset  . Irritable bowel syndrome Mother   . Other Neg Hx     Social History Social History   Tobacco Use  . Smoking status: Current Every Day Smoker    Packs/day: 0.50    Years: 14.00    Pack years: 7.00    Types: Cigarettes  . Smokeless tobacco: Never Used  Substance Use Topics  . Alcohol use: Yes    Comment: occasioanal  . Drug use: Yes    Types: Marijuana    Comment: last used 04/24/13    Review of Systems  Constitutional: Negative for fever, chills or body aches. Cardiovascular: Negative for chest pain or chest tightness. Respiratory:  Negative for cough or shortness of breath. Musculoskeletal: Positive for right heel pain.  Negative for knee, hip or back pain Skin: Positive for blister to right heel. Neurological: Positive for numbness and tingling of the right heel.  Negative for focal weakness. ____________________________________________  PHYSICAL EXAM:  VITAL SIGNS: ED Triage Vitals  Enc Vitals Group     BP 11/01/20 1250 115/88     Pulse Rate 11/01/20 1250 84     Resp 11/01/20 1250 16     Temp 11/01/20 1250 98.4 F (36.9 C)     Temp Source 11/01/20 1250 Oral     SpO2 11/01/20 1250 100 %     Weight 11/01/20 1247 123 lb (55.8 kg)     Height 11/01/20 1247 5\' 3"  (1.6 m)     Head  Circumference --      Peak Flow --      Pain Score 11/01/20 1247 8     Pain Loc --      Pain Edu? --      Excl. in GC? --     Constitutional: Alert and oriented. Well appearing and in no distress. Head: Normocephalic and atraumatic. Eyes:  Normal extraocular movements Cardiovascular: Normal rate, regular rhythm.  Pedal pulses 2+ bilaterally Respiratory: Normal respiratory effort. No wheezes/rales/rhonchi. Musculoskeletal: Normal flexion, extension and rotation of the right ankle.  No joint swelling noted.  Pain with palpation under the left heel.  Gait not assessed. Neurologic:   Normal speech and language. No gross focal neurologic deficits are appreciated. Skin: 0.5 cm pus filled abscess noted of the right lateral heel. ____________________________________________    LABS  ____________________________________________   RADIOLOGY  Imaging Orders     DG Foot Complete Right IMPRESSION:  Negative exam.    ____________________________________________  PROCEDURES  .05/05/22Incision and Drainage  Date/Time: 11/01/2020 3:08 PM Performed by: 01/01/2021, NP Authorized by: Lorre Munroe, NP   Consent:    Consent obtained:  Verbal   Consent given by:  Patient   Risks, benefits, and alternatives were discussed: yes     Risks discussed:  Bleeding, incomplete drainage, pain and infection   Alternatives discussed:  Alternative treatment Universal protocol:    Procedure explained and questions answered to patient or proxy's satisfaction: yes     Relevant documents present and verified: no     Test results available : yes     Imaging studies available: yes     Required blood products, implants, devices, and special equipment available: yes     Site/side marked: yes     Immediately prior to procedure, a time out was called: yes     Patient identity confirmed:  Verbally with patient and arm band Location:    Type:  Abscess   Location:  Lower extremity   Lower extremity location:   Foot   Foot location:  R foot Pre-procedure details:    Skin preparation:  Povidone-iodine Sedation:    Sedation type:  None Anesthesia:    Anesthesia method:  Local infiltration   Local anesthetic:  Lidocaine 1% w/o epi Procedure type:    Complexity:  Simple Procedure details:    Ultrasound guidance: no     Needle aspiration: no     Incision types:  Stab incision   Incision depth:  Dermal   Wound management:  Probed and deloculated   Drainage:  Purulent   Drainage amount:  Scant   Wound treatment:  Wound left open   Packing materials:  None Post-procedure  details:    Procedure completion:  Tolerated well, no immediate complications   ____________________________________________  INITIAL IMPRESSION / ASSESSMENT AND PLAN / ED COURSE  Acute Right Heel Pain, Abscess Right Heel:  DDx include calcaneal spur, plantar fascitis, abscess of foot Xray right foot does not show evidence of heel spur or osteomyelitis Pain likely coming from infection  Keflex 500 mg PO x 1 I&D performed RX for Keflex 500 mg TID x 7 days Return precautions discussed   _________________________  FINAL CLINICAL IMPRESSION(S) / ED DIAGNOSES  Final diagnoses:  Pain of right heel  Abscess of right heel      Lorre Munroe, NP 11/01/20 1513    Jene Every, MD 11/01/20 1515

## 2021-02-10 ENCOUNTER — Other Ambulatory Visit: Payer: Self-pay

## 2021-02-10 ENCOUNTER — Emergency Department
Admission: EM | Admit: 2021-02-10 | Discharge: 2021-02-10 | Disposition: A | Discharge status: Home / Self Care | Payer: Self-pay | Attending: Emergency Medicine | Admitting: Emergency Medicine

## 2021-02-10 ENCOUNTER — Encounter: Payer: Self-pay | Admitting: Emergency Medicine

## 2021-02-10 DIAGNOSIS — J029 Acute pharyngitis, unspecified: Secondary | ICD-10-CM | POA: Insufficient documentation

## 2021-02-10 DIAGNOSIS — F17210 Nicotine dependence, cigarettes, uncomplicated: Secondary | ICD-10-CM | POA: Insufficient documentation

## 2021-02-10 DIAGNOSIS — H9202 Otalgia, left ear: Secondary | ICD-10-CM | POA: Insufficient documentation

## 2021-02-10 DIAGNOSIS — Z7982 Long term (current) use of aspirin: Secondary | ICD-10-CM | POA: Insufficient documentation

## 2021-02-10 DIAGNOSIS — J45909 Unspecified asthma, uncomplicated: Secondary | ICD-10-CM | POA: Insufficient documentation

## 2021-02-10 DIAGNOSIS — G43909 Migraine, unspecified, not intractable, without status migrainosus: Secondary | ICD-10-CM | POA: Insufficient documentation

## 2021-02-10 MED ORDER — KETOROLAC TROMETHAMINE 10 MG PO TABS: 10.0000 mg | ORAL_TABLET | Freq: Three times a day (TID) | ORAL | 0 refills | Status: AC

## 2021-02-10 MED ORDER — METOCLOPRAMIDE HCL 5 MG PO TABS: 5.0000 mg | ORAL_TABLET | Freq: Three times a day (TID) | ORAL | 0 refills | Status: AC | PRN

## 2021-02-10 MED ORDER — KETOROLAC TROMETHAMINE 30 MG/ML IJ SOLN
30.0000 mg | Freq: Once | INTRAMUSCULAR | Status: AC
  Administered 2021-02-10: 30 mg via INTRAMUSCULAR
  Filled 2021-02-10: qty 1

## 2021-02-10 MED ORDER — METOCLOPRAMIDE HCL 10 MG PO TABS
10.0000 mg | ORAL_TABLET | Freq: Once | ORAL | Status: AC
  Administered 2021-02-10: 10 mg via ORAL
  Filled 2021-02-10: qty 1

## 2021-02-10 MED ORDER — DIPHENHYDRAMINE HCL 25 MG PO CAPS
25.0000 mg | ORAL_CAPSULE | Freq: Once | ORAL | Status: AC
  Administered 2021-02-10: 25 mg via ORAL
  Filled 2021-02-10: qty 1

## 2021-02-10 NOTE — ED Triage Notes (Signed)
Pt to ED via POV, c/o L ear pain and sore throat, pt also c/o HA x 2 days.

## 2021-02-10 NOTE — Discharge Instructions (Addendum)
Take the prescription meds as directed. Follow-up with [facility] as needed.

## 2021-02-10 NOTE — ED Notes (Signed)
See triage note  Presents with h/a  States hx of migraines but has not had one lately  Also having left ear pain and sore throat  Denies any fever

## 2021-02-12 NOTE — ED Provider Notes (Signed)
[facility] Emergency Department Provider Note ____________________________________________  Time seen: 1128  I have reviewed the triage vital signs and the nursing notes.  HISTORY  Chief Complaint  Headache and Otalgia   HPI Joyce Klein is a 35 y.o. female with a history of migraines generally managed with acetaminophen or Excedrin, presents to the ED for evaluation of a 2-day headache.  She describes onset from the posterior occiput of the head towards the crown of the head.  She reports some light sensitivity and nausea, but denies any vomiting, vision change, or weakness.  She does report some associated left ear pain and sore throat.  She denies any frank fevers, chills, sweats, chest pain, or shortness of breath.  Past Medical History:  Diagnosis Date   Asthma    Bipolar 1 disorder (HCC)    Blood transfusion without reported diagnosis 2005   PPH   Depression    Headache(784.0)    Migraine    Schizophrenia (HCC)    Shortness of breath     Patient Active Problem List   Diagnosis Date Noted   Dental implant pain 09/05/2015   Bipolar affect, depressed (HCC) 03/14/2012   History of suicide attempt 03/14/2012   Drug abuse (HCC) 03/14/2012   Obesity 03/14/2012   Smoker 03/14/2012   H/O tubal ligation 03/14/2012   Migraines 03/14/2012   GERD (gastroesophageal reflux disease) 03/14/2012   Gallstones 03/14/2012    Past Surgical History:  Procedure Laterality Date   TUBAL LIGATION     TUBAL LIGATION      Prior to Admission medications   Medication Sig Start Date End Date Taking? Authorizing Provider  ketorolac (TORADOL) 10 MG tablet Take 1 tablet (10 mg total) by mouth every 8 (eight) hours. 02/10/21  Yes Dezmon Conover, Charlesetta Ivory, PA-C  metoCLOPramide (REGLAN) 5 MG tablet Take 1 tablet (5 mg total) by mouth every 8 (eight) hours as needed for up to 5 days for nausea or vomiting. 02/10/21 02/15/21 Yes Cory Rama, Charlesetta Ivory, PA-C  acetaminophen  (TYLENOL) 500 MG tablet Take 1,500 mg by mouth every 6 (six) hours as needed for headache.     [provider]  albuterol (PROVENTIL HFA;VENTOLIN HFA) 108 (90 BASE) MCG/ACT inhaler Inhale 2 puffs into the lungs every 6 (six) hours as needed. For shortness of breath    [provider]  aspirin-acetaminophen-caffeine (EXCEDRIN MIGRAINE) [phone] MG per tablet Take 1 tablet by mouth every 6 (six) hours as needed for headache.     [provider]    Allergies Bee venom, Coconut oil, and Other  Family History  Problem Relation Age of Onset   Irritable bowel syndrome Mother    Other Neg Hx     Social History Social History   Tobacco Use   Smoking status: Every Day    Packs/day: 0.50    Years: 14.00    Pack years: 7.00    Types: Cigarettes   Smokeless tobacco: Never  Substance Use Topics   Alcohol use: Yes    Comment: occasioanal   Drug use: Yes    Types: Marijuana    Comment: last used 04/24/13    Review of Systems  Constitutional: Negative for fever. Eyes: Negative for visual changes. ENT: Negative for sore throat. Cardiovascular: Negative for chest pain. Respiratory: Negative for shortness of breath. Gastrointestinal: Negative for abdominal pain, vomiting and diarrhea. Genitourinary: Negative for dysuria. Musculoskeletal: Negative for back pain. Skin: Negative for rash. Neurological: Positive for headache.denies focal weakness or  numbness. ____________________________________________  PHYSICAL EXAM:  VITAL SIGNS: ED Triage Vitals  Enc Vitals Group     BP 02/10/21 0940 121/76     Pulse Rate 02/10/21 0940 80     Resp --      Temp --      Temp src --      SpO2 02/10/21 0940 100 %     Weight 02/10/21 0927 124 lb (56.2 kg)     Height 02/10/21 0927 5' 3.5" (1.613 m)     Head Circumference --      Peak Flow --      Pain Score 02/10/21 0927 7     Pain Loc --      Pain Edu? --      Excl. in GC? --     Constitutional: Alert and  oriented. Well appearing and in no distress. GCS = 15 Head: Normocephalic and atraumatic. Eyes: Conjunctivae are normal. PERRL. Normal extraocular movements and fundi bilaterally Ears: Canals clear. TMs intact bilaterally. Nose: No congestion/rhinorrhea/epistaxis. Mouth/Throat: Mucous membranes are moist.  Uvula is midline Neck: Supple. No thyromegaly. Hematological/Lymphatic/Immunological: No cervical lymphadenopathy. Cardiovascular: Normal rate, regular rhythm. Normal distal pulses. Respiratory: Normal respiratory effort. No wheezes/rales/rhonchi. Gastrointestinal: Soft and nontender. No distention. Musculoskeletal: Nontender with normal range of motion in all extremities.  Neurologic: Cranial nerves II to XII grossly intact.  Normal gait without ataxia. Normal speech and language. No gross focal neurologic deficits are appreciated. Skin:  Skin is warm, dry and intact. No rash noted. ____________________________________________  PROCEDURES  Toradol 30 mg IM Reglan 10 mg p.o. Benadryl 25 mg p.o.  Procedures ____________________________________________   INITIAL IMPRESSION / ASSESSMENT AND PLAN / ED COURSE  As part of my medical decision making, I reviewed the following data within the electronic MEDICAL RECORD NUMBER Old chart reviewed and Notes from prior ED visits   Differential diagnosis includes, but is not limited to, intracranial hemorrhage, meningitis/encephalitis, previous head trauma, cavernous venous thrombosis, tension headache, temporal arteritis, migraine or migraine equivalent, idiopathic intracranial hypertension, and non-specific headache.  Patient ED evaluation of what she describes as typical migraine presentation for her.  Patient is on any current preventative or abortive medication therapies.  She is otherwise stable at this time without any acute neurologic deficit, vision change, weakness, or syncope.  No red flags on exam.  Patient vital signs are stable at this  time.  She is reporting significant improvement of her pain after ED medication administration.  She will be discharged with a prescription for ketorolac to take as directed for continued headache management.  She is also given a small prescription from metoclopramide for nausea management and headache control.  She will follow-up with her primary provider for ongoing symptoms.  Return precautions have been discussed.    Joyce Klein was evaluated in Emergency Department on 02/12/2021 for the symptoms described in the history of present illness. She was evaluated in the context of the global COVID-19 pandemic, which necessitated consideration that the patient might be at risk for infection with the SARS-CoV-2 virus that causes COVID-19. Institutional protocols and algorithms that pertain to the evaluation of patients at risk for COVID-19 are in a state of rapid change based on information released by regulatory bodies including the CDC and federal and state organizations. These policies and algorithms were followed during the patient's care in the ED. ____________________________________________  FINAL CLINICAL IMPRESSION(S) / ED DIAGNOSES  Final diagnoses:  Migraine without status migrainosus, not intractable, unspecified migraine type  Lissa Hoard, PA-C 02/12/21 1215    Jene Every, MD 02/13/21 [phone]

## 2022-09-03 DIAGNOSIS — F17210 Nicotine dependence, cigarettes, uncomplicated: Secondary | ICD-10-CM | POA: Diagnosis not present

## 2022-09-03 DIAGNOSIS — M791 Myalgia, unspecified site: Secondary | ICD-10-CM | POA: Diagnosis not present

## 2022-09-03 DIAGNOSIS — Z1152 Encounter for screening for COVID-19: Secondary | ICD-10-CM | POA: Diagnosis not present

## 2022-09-03 DIAGNOSIS — R059 Cough, unspecified: Secondary | ICD-10-CM | POA: Diagnosis not present

## 2022-09-03 DIAGNOSIS — Z91018 Allergy to other foods: Secondary | ICD-10-CM | POA: Diagnosis not present

## 2022-09-05 DIAGNOSIS — B9562 Methicillin resistant Staphylococcus aureus infection as the cause of diseases classified elsewhere: Secondary | ICD-10-CM | POA: Diagnosis not present

## 2022-09-05 DIAGNOSIS — J189 Pneumonia, unspecified organism: Secondary | ICD-10-CM | POA: Diagnosis not present

## 2022-09-05 DIAGNOSIS — J869 Pyothorax without fistula: Secondary | ICD-10-CM | POA: Diagnosis not present

## 2022-09-05 DIAGNOSIS — J948 Other specified pleural conditions: Secondary | ICD-10-CM | POA: Diagnosis not present

## 2022-09-05 DIAGNOSIS — F17210 Nicotine dependence, cigarettes, uncomplicated: Secondary | ICD-10-CM | POA: Diagnosis not present

## 2022-09-05 DIAGNOSIS — A5901 Trichomonal vulvovaginitis: Secondary | ICD-10-CM | POA: Diagnosis not present

## 2022-09-05 DIAGNOSIS — R6520 Severe sepsis without septic shock: Secondary | ICD-10-CM | POA: Diagnosis not present

## 2022-09-05 DIAGNOSIS — E878 Other disorders of electrolyte and fluid balance, not elsewhere classified: Secondary | ICD-10-CM | POA: Diagnosis not present

## 2022-09-05 DIAGNOSIS — E876 Hypokalemia: Secondary | ICD-10-CM | POA: Diagnosis not present

## 2022-09-05 DIAGNOSIS — A4102 Sepsis due to Methicillin resistant Staphylococcus aureus: Secondary | ICD-10-CM | POA: Diagnosis not present

## 2022-09-05 DIAGNOSIS — K219 Gastro-esophageal reflux disease without esophagitis: Secondary | ICD-10-CM | POA: Diagnosis not present

## 2022-09-05 DIAGNOSIS — J90 Pleural effusion, not elsewhere classified: Secondary | ICD-10-CM | POA: Diagnosis not present

## 2022-09-05 DIAGNOSIS — J1000 Influenza due to other identified influenza virus with unspecified type of pneumonia: Secondary | ICD-10-CM | POA: Diagnosis not present

## 2022-09-05 DIAGNOSIS — K8020 Calculus of gallbladder without cholecystitis without obstruction: Secondary | ICD-10-CM | POA: Diagnosis not present

## 2022-09-05 DIAGNOSIS — I9589 Other hypotension: Secondary | ICD-10-CM | POA: Diagnosis not present

## 2022-09-05 DIAGNOSIS — J9601 Acute respiratory failure with hypoxia: Secondary | ICD-10-CM | POA: Diagnosis not present

## 2022-09-05 DIAGNOSIS — E669 Obesity, unspecified: Secondary | ICD-10-CM | POA: Diagnosis not present

## 2022-09-05 DIAGNOSIS — E871 Hypo-osmolality and hyponatremia: Secondary | ICD-10-CM | POA: Diagnosis not present

## 2022-09-05 DIAGNOSIS — F319 Bipolar disorder, unspecified: Secondary | ICD-10-CM | POA: Diagnosis not present

## 2022-09-05 DIAGNOSIS — I079 Rheumatic tricuspid valve disease, unspecified: Secondary | ICD-10-CM | POA: Diagnosis not present

## 2022-09-05 DIAGNOSIS — I2690 Septic pulmonary embolism without acute cor pulmonale: Secondary | ICD-10-CM | POA: Diagnosis not present

## 2022-09-05 DIAGNOSIS — E870 Hyperosmolality and hypernatremia: Secondary | ICD-10-CM | POA: Diagnosis not present

## 2022-09-06 DIAGNOSIS — J90 Pleural effusion, not elsewhere classified: Secondary | ICD-10-CM | POA: Diagnosis not present

## 2022-09-06 DIAGNOSIS — J189 Pneumonia, unspecified organism: Secondary | ICD-10-CM | POA: Diagnosis not present

## 2022-09-06 DIAGNOSIS — J948 Other specified pleural conditions: Secondary | ICD-10-CM | POA: Diagnosis not present

## 2022-09-06 DIAGNOSIS — R918 Other nonspecific abnormal finding of lung field: Secondary | ICD-10-CM | POA: Diagnosis not present

## 2022-09-06 DIAGNOSIS — A419 Sepsis, unspecified organism: Secondary | ICD-10-CM | POA: Diagnosis not present

## 2022-09-06 DIAGNOSIS — Z4682 Encounter for fitting and adjustment of non-vascular catheter: Secondary | ICD-10-CM | POA: Diagnosis not present

## 2022-09-06 DIAGNOSIS — J101 Influenza due to other identified influenza virus with other respiratory manifestations: Secondary | ICD-10-CM | POA: Diagnosis not present

## 2022-09-06 DIAGNOSIS — I081 Rheumatic disorders of both mitral and tricuspid valves: Secondary | ICD-10-CM | POA: Diagnosis not present

## 2022-09-07 DIAGNOSIS — R918 Other nonspecific abnormal finding of lung field: Secondary | ICD-10-CM | POA: Diagnosis not present

## 2022-09-07 DIAGNOSIS — R6520 Severe sepsis without septic shock: Secondary | ICD-10-CM | POA: Diagnosis not present

## 2022-09-07 DIAGNOSIS — J9601 Acute respiratory failure with hypoxia: Secondary | ICD-10-CM | POA: Diagnosis not present

## 2022-09-07 DIAGNOSIS — I2690 Septic pulmonary embolism without acute cor pulmonale: Secondary | ICD-10-CM | POA: Diagnosis not present

## 2022-09-07 DIAGNOSIS — J90 Pleural effusion, not elsewhere classified: Secondary | ICD-10-CM | POA: Diagnosis not present

## 2022-09-07 DIAGNOSIS — M25561 Pain in right knee: Secondary | ICD-10-CM | POA: Diagnosis not present

## 2022-09-07 DIAGNOSIS — J101 Influenza due to other identified influenza virus with other respiratory manifestations: Secondary | ICD-10-CM | POA: Diagnosis not present

## 2022-09-07 DIAGNOSIS — A4102 Sepsis due to Methicillin resistant Staphylococcus aureus: Secondary | ICD-10-CM | POA: Diagnosis not present

## 2022-09-07 DIAGNOSIS — I079 Rheumatic tricuspid valve disease, unspecified: Secondary | ICD-10-CM | POA: Diagnosis not present

## 2022-09-07 DIAGNOSIS — J189 Pneumonia, unspecified organism: Secondary | ICD-10-CM | POA: Diagnosis not present

## 2022-09-07 DIAGNOSIS — J869 Pyothorax without fistula: Secondary | ICD-10-CM | POA: Diagnosis not present

## 2022-09-07 DIAGNOSIS — F17210 Nicotine dependence, cigarettes, uncomplicated: Secondary | ICD-10-CM | POA: Diagnosis not present

## 2022-09-08 DIAGNOSIS — J101 Influenza due to other identified influenza virus with other respiratory manifestations: Secondary | ICD-10-CM | POA: Diagnosis not present

## 2022-09-08 DIAGNOSIS — J869 Pyothorax without fistula: Secondary | ICD-10-CM | POA: Diagnosis not present

## 2022-09-08 DIAGNOSIS — R55 Syncope and collapse: Secondary | ICD-10-CM | POA: Diagnosis not present

## 2022-09-08 DIAGNOSIS — R6520 Severe sepsis without septic shock: Secondary | ICD-10-CM | POA: Diagnosis not present

## 2022-09-08 DIAGNOSIS — A4102 Sepsis due to Methicillin resistant Staphylococcus aureus: Secondary | ICD-10-CM | POA: Diagnosis not present

## 2022-09-08 DIAGNOSIS — F17210 Nicotine dependence, cigarettes, uncomplicated: Secondary | ICD-10-CM | POA: Diagnosis not present

## 2022-09-08 DIAGNOSIS — J1000 Influenza due to other identified influenza virus with unspecified type of pneumonia: Secondary | ICD-10-CM | POA: Diagnosis not present

## 2022-09-08 DIAGNOSIS — J9601 Acute respiratory failure with hypoxia: Secondary | ICD-10-CM | POA: Diagnosis not present

## 2022-09-08 DIAGNOSIS — J189 Pneumonia, unspecified organism: Secondary | ICD-10-CM | POA: Diagnosis not present

## 2022-09-08 DIAGNOSIS — J90 Pleural effusion, not elsewhere classified: Secondary | ICD-10-CM | POA: Diagnosis not present

## 2022-09-08 DIAGNOSIS — I2690 Septic pulmonary embolism without acute cor pulmonale: Secondary | ICD-10-CM | POA: Diagnosis not present

## 2022-09-08 DIAGNOSIS — I079 Rheumatic tricuspid valve disease, unspecified: Secondary | ICD-10-CM | POA: Diagnosis not present

## 2022-09-09 DIAGNOSIS — A4102 Sepsis due to Methicillin resistant Staphylococcus aureus: Secondary | ICD-10-CM | POA: Diagnosis not present

## 2022-09-09 DIAGNOSIS — I2690 Septic pulmonary embolism without acute cor pulmonale: Secondary | ICD-10-CM | POA: Diagnosis not present

## 2022-09-09 DIAGNOSIS — J90 Pleural effusion, not elsewhere classified: Secondary | ICD-10-CM | POA: Diagnosis not present

## 2022-09-09 DIAGNOSIS — R6520 Severe sepsis without septic shock: Secondary | ICD-10-CM | POA: Diagnosis not present

## 2022-09-09 DIAGNOSIS — F17210 Nicotine dependence, cigarettes, uncomplicated: Secondary | ICD-10-CM | POA: Diagnosis not present

## 2022-09-09 DIAGNOSIS — R918 Other nonspecific abnormal finding of lung field: Secondary | ICD-10-CM | POA: Diagnosis not present

## 2022-09-09 DIAGNOSIS — J9601 Acute respiratory failure with hypoxia: Secondary | ICD-10-CM | POA: Diagnosis not present

## 2022-09-09 DIAGNOSIS — J101 Influenza due to other identified influenza virus with other respiratory manifestations: Secondary | ICD-10-CM | POA: Diagnosis not present

## 2022-09-09 DIAGNOSIS — I079 Rheumatic tricuspid valve disease, unspecified: Secondary | ICD-10-CM | POA: Diagnosis not present

## 2022-09-09 DIAGNOSIS — J189 Pneumonia, unspecified organism: Secondary | ICD-10-CM | POA: Diagnosis not present

## 2022-09-09 DIAGNOSIS — J869 Pyothorax without fistula: Secondary | ICD-10-CM | POA: Diagnosis not present

## 2022-09-10 DIAGNOSIS — A4102 Sepsis due to Methicillin resistant Staphylococcus aureus: Secondary | ICD-10-CM | POA: Diagnosis not present

## 2022-09-10 DIAGNOSIS — F17210 Nicotine dependence, cigarettes, uncomplicated: Secondary | ICD-10-CM | POA: Diagnosis not present

## 2022-09-10 DIAGNOSIS — I079 Rheumatic tricuspid valve disease, unspecified: Secondary | ICD-10-CM | POA: Diagnosis not present

## 2022-09-10 DIAGNOSIS — J9601 Acute respiratory failure with hypoxia: Secondary | ICD-10-CM | POA: Diagnosis not present

## 2022-09-10 DIAGNOSIS — J869 Pyothorax without fistula: Secondary | ICD-10-CM | POA: Diagnosis not present

## 2022-09-10 DIAGNOSIS — I2690 Septic pulmonary embolism without acute cor pulmonale: Secondary | ICD-10-CM | POA: Diagnosis not present

## 2022-09-10 DIAGNOSIS — R6520 Severe sepsis without septic shock: Secondary | ICD-10-CM | POA: Diagnosis not present

## 2022-09-10 DIAGNOSIS — J101 Influenza due to other identified influenza virus with other respiratory manifestations: Secondary | ICD-10-CM | POA: Diagnosis not present

## 2022-09-10 DIAGNOSIS — J189 Pneumonia, unspecified organism: Secondary | ICD-10-CM | POA: Diagnosis not present

## 2022-09-11 DIAGNOSIS — J90 Pleural effusion, not elsewhere classified: Secondary | ICD-10-CM | POA: Diagnosis not present

## 2022-09-11 DIAGNOSIS — J869 Pyothorax without fistula: Secondary | ICD-10-CM | POA: Diagnosis not present

## 2022-09-11 DIAGNOSIS — R590 Localized enlarged lymph nodes: Secondary | ICD-10-CM | POA: Diagnosis not present

## 2022-09-11 DIAGNOSIS — J986 Disorders of diaphragm: Secondary | ICD-10-CM | POA: Diagnosis not present

## 2022-09-11 DIAGNOSIS — J189 Pneumonia, unspecified organism: Secondary | ICD-10-CM | POA: Diagnosis not present

## 2022-09-11 DIAGNOSIS — R918 Other nonspecific abnormal finding of lung field: Secondary | ICD-10-CM | POA: Diagnosis not present

## 2022-09-11 DIAGNOSIS — Z978 Presence of other specified devices: Secondary | ICD-10-CM | POA: Diagnosis not present

## 2022-09-11 DIAGNOSIS — I2690 Septic pulmonary embolism without acute cor pulmonale: Secondary | ICD-10-CM | POA: Diagnosis not present

## 2022-09-11 DIAGNOSIS — Z95828 Presence of other vascular implants and grafts: Secondary | ICD-10-CM | POA: Diagnosis not present

## 2022-09-12 DIAGNOSIS — A4102 Sepsis due to Methicillin resistant Staphylococcus aureus: Secondary | ICD-10-CM | POA: Diagnosis not present

## 2022-09-12 DIAGNOSIS — J9601 Acute respiratory failure with hypoxia: Secondary | ICD-10-CM | POA: Diagnosis not present

## 2022-09-12 DIAGNOSIS — R6520 Severe sepsis without septic shock: Secondary | ICD-10-CM | POA: Diagnosis not present

## 2022-09-13 DIAGNOSIS — R6520 Severe sepsis without septic shock: Secondary | ICD-10-CM | POA: Diagnosis not present

## 2022-09-13 DIAGNOSIS — Z4682 Encounter for fitting and adjustment of non-vascular catheter: Secondary | ICD-10-CM | POA: Diagnosis not present

## 2022-09-13 DIAGNOSIS — F17210 Nicotine dependence, cigarettes, uncomplicated: Secondary | ICD-10-CM | POA: Diagnosis not present

## 2022-09-13 DIAGNOSIS — J869 Pyothorax without fistula: Secondary | ICD-10-CM | POA: Diagnosis not present

## 2022-09-13 DIAGNOSIS — J101 Influenza due to other identified influenza virus with other respiratory manifestations: Secondary | ICD-10-CM | POA: Diagnosis not present

## 2022-09-13 DIAGNOSIS — J90 Pleural effusion, not elsewhere classified: Secondary | ICD-10-CM | POA: Diagnosis not present

## 2022-09-13 DIAGNOSIS — I079 Rheumatic tricuspid valve disease, unspecified: Secondary | ICD-10-CM | POA: Diagnosis not present

## 2022-09-13 DIAGNOSIS — I2690 Septic pulmonary embolism without acute cor pulmonale: Secondary | ICD-10-CM | POA: Diagnosis not present

## 2022-09-13 DIAGNOSIS — J9601 Acute respiratory failure with hypoxia: Secondary | ICD-10-CM | POA: Diagnosis not present

## 2022-09-13 DIAGNOSIS — R918 Other nonspecific abnormal finding of lung field: Secondary | ICD-10-CM | POA: Diagnosis not present

## 2022-09-13 DIAGNOSIS — A4102 Sepsis due to Methicillin resistant Staphylococcus aureus: Secondary | ICD-10-CM | POA: Diagnosis not present

## 2022-09-14 DIAGNOSIS — R6520 Severe sepsis without septic shock: Secondary | ICD-10-CM | POA: Diagnosis not present

## 2022-09-14 DIAGNOSIS — A4102 Sepsis due to Methicillin resistant Staphylococcus aureus: Secondary | ICD-10-CM | POA: Diagnosis not present

## 2022-09-14 DIAGNOSIS — J9601 Acute respiratory failure with hypoxia: Secondary | ICD-10-CM | POA: Diagnosis not present

## 2022-09-14 DIAGNOSIS — J869 Pyothorax without fistula: Secondary | ICD-10-CM | POA: Diagnosis not present

## 2022-09-14 DIAGNOSIS — I079 Rheumatic tricuspid valve disease, unspecified: Secondary | ICD-10-CM | POA: Diagnosis not present

## 2022-09-14 DIAGNOSIS — J101 Influenza due to other identified influenza virus with other respiratory manifestations: Secondary | ICD-10-CM | POA: Diagnosis not present

## 2022-09-14 DIAGNOSIS — J189 Pneumonia, unspecified organism: Secondary | ICD-10-CM | POA: Diagnosis not present

## 2022-09-14 DIAGNOSIS — I2690 Septic pulmonary embolism without acute cor pulmonale: Secondary | ICD-10-CM | POA: Diagnosis not present

## 2022-09-14 DIAGNOSIS — F17210 Nicotine dependence, cigarettes, uncomplicated: Secondary | ICD-10-CM | POA: Diagnosis not present

## 2022-10-12 ENCOUNTER — Telehealth: Payer: Self-pay

## 2022-10-12 NOTE — Telephone Encounter (Signed)
Mychart msg sent. AS, CMA

## 2022-12-23 DIAGNOSIS — R0602 Shortness of breath: Secondary | ICD-10-CM | POA: Diagnosis not present

## 2022-12-23 DIAGNOSIS — J069 Acute upper respiratory infection, unspecified: Secondary | ICD-10-CM | POA: Diagnosis not present

## 2022-12-23 DIAGNOSIS — R45851 Suicidal ideations: Secondary | ICD-10-CM | POA: Diagnosis not present

## 2022-12-23 DIAGNOSIS — J984 Other disorders of lung: Secondary | ICD-10-CM | POA: Diagnosis not present

## 2022-12-23 DIAGNOSIS — R918 Other nonspecific abnormal finding of lung field: Secondary | ICD-10-CM | POA: Diagnosis not present

## 2022-12-27 ENCOUNTER — Telehealth: Payer: Self-pay | Admitting: *Deleted

## 2022-12-27 NOTE — Transitions of Care (Post Inpatient/ED Visit) (Signed)
   12/27/2022  Name: Joyce Klein MRN: [id number] DOB: April 22, 1986  Today's TOC FU Call Status: Today's TOC FU Call Status:: Unsuccessul Call (1st Attempt) Unsuccessful Call (1st Attempt) Date: 12/27/22  Attempted to reach the patient regarding the most recent Inpatient/ED visit.  Follow Up Plan: Additional outreach attempts will be made to reach the patient to complete the Transitions of Care (Post Inpatient/ED visit) call.   Estanislado Emms RN, BSN [place]  Managed [facility] RN Care Coordinator [phone]

## 2024-02-09 ENCOUNTER — Other Ambulatory Visit: Payer: Self-pay

## 2024-02-09 DIAGNOSIS — L309 Dermatitis, unspecified: Secondary | ICD-10-CM | POA: Diagnosis not present

## 2024-02-09 DIAGNOSIS — R21 Rash and other nonspecific skin eruption: Secondary | ICD-10-CM | POA: Diagnosis present

## 2024-02-09 NOTE — ED Triage Notes (Addendum)
 Pt reports 3 months ago she was bitten by 3 ticks, pt states aprox 1 week after she developed rash to arms legs and torso. Rash appears large red dry spots. Rash to bottom of feet as well

## 2024-02-10 ENCOUNTER — Emergency Department
Admission: EM | Admit: 2024-02-10 | Discharge: 2024-02-10 | Disposition: A | Discharge status: Home / Self Care | Attending: Emergency Medicine | Admitting: Emergency Medicine

## 2024-02-10 DIAGNOSIS — L309 Dermatitis, unspecified: Secondary | ICD-10-CM | POA: Diagnosis not present

## 2024-02-10 DIAGNOSIS — R21 Rash and other nonspecific skin eruption: Secondary | ICD-10-CM

## 2024-02-10 LAB — CBC WITH DIFFERENTIAL/PLATELET
Abs Immature Granulocytes: 0.02 10*3/uL (ref 0.00–0.07)
Basophils Absolute: 0 10*3/uL (ref 0.0–0.1)
Basophils Relative: 0 %
Eosinophils Absolute: 0.1 10*3/uL (ref 0.0–0.5)
Eosinophils Relative: 1 %
HCT: 40.5 % (ref 36.0–46.0)
Hemoglobin: 13.4 g/dL (ref 12.0–15.0)
Immature Granulocytes: 0 %
Lymphocytes Relative: 28 %
Lymphs Abs: 2.4 10*3/uL (ref 0.7–4.0)
MCH: 33.3 pg (ref 26.0–34.0)
MCHC: 33.1 g/dL (ref 30.0–36.0)
MCV: 100.5 fL — ABNORMAL HIGH (ref 80.0–100.0)
Monocytes Absolute: 0.7 10*3/uL (ref 0.1–1.0)
Monocytes Relative: 8 %
Neutro Abs: 5.1 10*3/uL (ref 1.7–7.7)
Neutrophils Relative %: 63 %
Platelets: 246 10*3/uL (ref 150–400)
RBC: 4.03 MIL/uL (ref 3.87–5.11)
RDW: 12.4 % (ref 11.5–15.5)
WBC: 8.3 10*3/uL (ref 4.0–10.5)
nRBC: 0 % (ref 0.0–0.2)

## 2024-02-10 LAB — BASIC METABOLIC PANEL WITH GFR
Anion gap: 8 (ref 5–15)
BUN: 15 mg/dL (ref 6–20)
CO2: 22 mmol/L (ref 22–32)
Calcium: 8.3 mg/dL — ABNORMAL LOW (ref 8.9–10.3)
Chloride: 107 mmol/L (ref 98–111)
Creatinine, Ser: 0.71 mg/dL (ref 0.44–1.00)
GFR, Estimated: >60 mL/min (ref >60)
Glucose, Bld: 117 mg/dL — ABNORMAL HIGH (ref 70–99)
Potassium: 3.6 mmol/L (ref 3.5–5.1)
Sodium: 137 mmol/L (ref 135–145)

## 2024-02-10 LAB — RAPID HIV SCREEN (HIV 1/2 AB+AG)
HIV 1/2 Antibodies: NONREACTIVE
HIV-1 P24 Antigen - HIV24: NONREACTIVE

## 2024-02-10 LAB — RPR: RPR Ser Ql: NONREACTIVE

## 2024-02-10 MED ORDER — PENICILLIN G BENZATHINE 1200000 UNIT/2ML IM SUSY
2.4000 10*6.[IU] | PREFILLED_SYRINGE | INTRAMUSCULAR | Status: AC
  Administered 2024-02-10: 2.4 10*6.[IU] via INTRAMUSCULAR
  Filled 2024-02-10: qty 4

## 2024-02-10 MED ORDER — OXYCODONE-ACETAMINOPHEN 5-325 MG PO TABS
2.0000 | ORAL_TABLET | Freq: Once | ORAL | Status: AC
  Administered 2024-02-10: 2 via ORAL
  Filled 2024-02-10: qty 2

## 2024-02-10 MED ORDER — DOXYCYCLINE HYCLATE 100 MG PO CAPS: 100.0000 mg | ORAL_CAPSULE | Freq: Two times a day (BID) | ORAL | 0 refills | Status: AC

## 2024-02-10 MED ORDER — PREDNISONE 10 MG PO TABS: ORAL_TABLET | ORAL | 0 refills | Status: AC

## 2024-02-10 MED ORDER — DOXYCYCLINE HYCLATE 100 MG PO TABS
100.0000 mg | ORAL_TABLET | Freq: Once | ORAL | Status: AC
  Administered 2024-02-10: 100 mg via ORAL
  Filled 2024-02-10: qty 1

## 2024-02-10 NOTE — Discharge Instructions (Addendum)
 As we discussed, there are multiple things that could be causing your rash, and we sent off multiple lab tests tonight that will come back at some point over the next few days.  You can check the results in MyChart.  In case you have positive/reactive results on your RPR (syphilis) test or your [facility] spotted fever (RMSF) test, we provided you with follow-up information for the infectious disease clinic.  Most importantly we recommend you call the [facility] dermatology clinic and schedule the next available follow-up appointment.  We have also referred you to the primary care system and someone should reach out to you to schedule follow-up appointment so that you can establish a primary care provider.  Please take the full course of antibiotics prescribed.  We also gave you the one-time injection of antibiotics.  We also provided a steroid taper which may help with the rash, especially the eczema component.  You may also want to start using an over-the-counter cream such as clotrimazole or other athlete's foot medication which may help with the bottoms of your feet.  We provided you follow-up information for a foot specialist as well if you would like to call and schedule an appointment.  If you develop new or worsening symptoms that concern you, please return immediately to the emergency department for additional evaluation.

## 2024-02-10 NOTE — ED Provider Notes (Signed)
 [facility] Provider Note    Event Date/Time   First MD Initiated Contact with Patient 02/10/24 0105     (approximate)   History   Rash   HPI Joyce Klein is a 38 y.o. female who presents for evaluation of extensive rash.  She has a history of eczema and said that she for started developing new lesions and worsening lesions on her legs about 2 months ago that she assumed was just her eczema.  However, within the last few weeks she has developed different appearing lesions on her torso and on the bottoms of her feet.  No lesions on the palms of her hands.  No intraoral lesions.  She said that the areas particularly on her legs are very itchy but also painful and her feet are quite painful as well.  She has never been to a dermatologist.  She treats the eczema with hydrocortisone cream which she has done for years.  She does not take any oral medications.  She has not taken any new oral medicines, has not sustained any burns, has no seasonal or environmental allergies of which she is aware.  No recent antibiotics.  Of note, the patient said that 2 months ago before this started she found 3 ticks on her body which she pulled off within hours (she thinks) of them being in place.  She has not had any fevers.  She says she has felt increasingly fatigued.     Physical Exam   Triage Vital Signs: ED Triage Vitals  Encounter Vitals Group     BP 02/09/24 2347 103/80     Girls Systolic BP Percentile --      Girls Diastolic BP Percentile --      Boys Systolic BP Percentile --      Boys Diastolic BP Percentile --      Pulse Rate 02/09/24 2347 (!) 111     Resp 02/09/24 2347 18     Temp 02/09/24 2347 98.3 F (36.8 C)     Temp src --      SpO2 02/09/24 2347 100 %     Weight 02/09/24 2346 60.3 kg (133 lb)     Height 02/09/24 2346 1.626 m (5' 4)     Head Circumference --      Peak Flow --      Pain Score 02/09/24 2346 10     Pain Loc --      Pain Education --       Exclude from Growth Chart --     Most recent vital signs: Vitals:   02/09/24 2347 02/10/24 0435  BP: 103/80 115/85  Pulse: (!) 111 95  Resp: 18 18  Temp: 98.3 F (36.8 C) 98.2 F (36.8 C)  SpO2: 100% 99%    General: Awake, no obvious distress. CV:  Good peripheral perfusion.  Regular rate and rhythm. Resp:  Normal effort. Speaking easily and comfortably, no accessory muscle usage nor intercostal retractions.   Abd:  No distention.  Other:  Extensive rash over most of her skin surfaces, sparing her face and the palms of her hands.  She has no intraoral lesions.  She has some desquamation of the skin of her feet that appears most consistent with fungal infection.  The lesions on her torso are circular, maculopapular and erythematous.  The lesions on her legs are primarily eczematous plaques, but towards the bottom of her legs and the tops of her feet are the circular maculopapular lesions  that are seen more on her torso.  See photos below.  There is no evidence of cellulitis, impetigo, nor other infectious process.          ED Results / Procedures / Treatments   Labs (all labs ordered are listed, but only abnormal results are displayed) Labs Reviewed  CBC WITH DIFFERENTIAL/PLATELET - Abnormal; Notable for the following components:      Result Value   MCV 100.5 (*)    All other components within normal limits  BASIC METABOLIC PANEL WITH GFR - Abnormal; Notable for the following components:   Glucose, Bld 117 (*)    Calcium  8.3 (*)    All other components within normal limits  RAPID HIV SCREEN (HIV 1/2 AB+AG)  RPR  ROCKY MTN SPOTTED FVR ABS PNL(IGG+IGM)      PROCEDURES:  Critical Care performed: No  Procedures    IMPRESSION / MDM / ASSESSMENT AND PLAN / ED COURSE  I reviewed the triage vital signs and the nursing notes.                              Differential diagnosis includes, but is not limited to, eczema, RMSF,   Patient's presentation is most  consistent with acute presentation with potential threat to life or bodily function.  Labs/studies ordered: BMP, CBC with differential, rapid HIV, [place] spotted fever, RPR  Interventions/Medications given:  Medications  oxyCODONE -acetaminophen  (PERCOCET/ROXICET) 5-325 MG per tablet 2 tablet (2 tablets Oral Given 02/10/24 0308)  penicillin  g benzathine (BICILLIN LA) 1200000 UNIT/2ML injection 2.4 Million Units (2.4 Million Units Intramuscular Given 02/10/24 0335)  doxycycline  (VIBRA -TABS) tablet 100 mg (100 mg Oral Given 02/10/24 0334)    (Note:  hospital course my include additional interventions and/or labs/studies not listed above.)   I suspect multiple etiologies are the cause of the patient's rashes.  She definitely has a severe case of eczema.  However the more circular lesions are unlikely to be eczema, may be RMSF or even secondary syphilis given some involvement on the bottoms of her foot, although that also looks to have a fungal component.  The patient has limited outpatient follow-up resources at this point I am concerned about the possibility of her following up.  We had a risk-benefit discussion and decided to treat aggressively and empirically.  I will treat her with a dose of penicillin  G 2,400,000 units intramuscular which should be appropriate to treat secondary syphilis.  However I will also treat with a 14-day course of doxycycline , which is an alternative treatment for syphilis but will also treat RMSF.  We will send labs but she knows that other than the basic labs, they will not come back tonight, and she will need to check MyChart.  I will give her follow-up information with appropriate specialists as documented below in the clinical course.  I am also going to treat her with a prednisone  taper to help with the primarily eczematous lesions that are causing her the worst problems on her Lower extremities.  She agrees with the current plan.  To Percocet for her discomfort at  this time.   Clinical Course as of 02/10/24 0707  Fri Feb 10, 2024  0358 Patient's CBC and BMP are both reassuring.  HIV is negative.  The rest of her labs will not come back tonight, and she knows that.  Will continue with the plan for empiric treatment and I am giving her follow-up information with both  infectious disease and dermatology.  Also referring her to primary care system.  I gave my usual and customary return precautions and prescriptions as listed below.  She agrees with the plan.  The patient's medical screening exam is reassuring with no indication of an emergent medical condition requiring hospitalization or additional evaluation at this point.  The patient is safe and appropriate for discharge and outpatient follow up. [CF]    Clinical Course User Index [CF] Lynnda Sas, MD     FINAL CLINICAL IMPRESSION(S) / ED DIAGNOSES   Final diagnoses:  Rash and nonspecific skin eruption  Eczema, unspecified type     Rx / DC Orders   ED Discharge Orders          Ordered    Ambulatory Referral to Primary Care (Establish Care)        02/10/24 0400    doxycycline  (VIBRAMYCIN ) 100 MG capsule  2 times daily        02/10/24 0407    predniSONE  (DELTASONE ) 10 MG tablet        02/10/24 0407             Note:  This document was prepared using Dragon voice recognition software and may include unintentional dictation errors.   Lynnda Sas, MD 02/10/24 [phone]

## 2024-03-14 ENCOUNTER — Encounter (HOSPITAL_COMMUNITY): Payer: Self-pay

## 2024-03-14 ENCOUNTER — Other Ambulatory Visit: Payer: Self-pay

## 2024-03-14 ENCOUNTER — Emergency Department (HOSPITAL_COMMUNITY)
Admission: EM | Admit: 2024-03-14 | Discharge: 2024-03-15 | Discharge status: Against Medical Advice | Attending: Emergency Medicine | Admitting: Emergency Medicine

## 2024-03-14 ENCOUNTER — Emergency Department (HOSPITAL_COMMUNITY)

## 2024-03-14 DIAGNOSIS — R0602 Shortness of breath: Secondary | ICD-10-CM | POA: Insufficient documentation

## 2024-03-14 DIAGNOSIS — Z5321 Procedure and treatment not carried out due to patient leaving prior to being seen by health care provider: Secondary | ICD-10-CM | POA: Diagnosis not present

## 2024-03-14 LAB — BASIC METABOLIC PANEL WITH GFR
Anion gap: 8 (ref 5–15)
BUN: 11 mg/dL (ref 6–20)
CO2: 25 mmol/L (ref 22–32)
Calcium: 8.7 mg/dL — ABNORMAL LOW (ref 8.9–10.3)
Chloride: 105 mmol/L (ref 98–111)
Creatinine, Ser: 0.73 mg/dL (ref 0.44–1.00)
GFR, Estimated: >60 mL/min (ref >60)
Glucose, Bld: 97 mg/dL (ref 70–99)
Potassium: 3.7 mmol/L (ref 3.5–5.1)
Sodium: 138 mmol/L (ref 135–145)

## 2024-03-14 LAB — CBC
HCT: 42.4 % (ref 36.0–46.0)
Hemoglobin: 14.1 g/dL (ref 12.0–15.0)
MCH: 33.6 pg (ref 26.0–34.0)
MCHC: 33.3 g/dL (ref 30.0–36.0)
MCV: 101 fL — ABNORMAL HIGH (ref 80.0–100.0)
Platelets: 233 K/uL (ref 150–400)
RBC: 4.2 MIL/uL (ref 3.87–5.11)
RDW: 12.4 % (ref 11.5–15.5)
WBC: 11.2 K/uL — ABNORMAL HIGH (ref 4.0–10.5)
nRBC: 0 % (ref 0.0–0.2)

## 2024-03-14 LAB — HCG, SERUM, QUALITATIVE: Preg, Serum: NEGATIVE

## 2024-03-14 NOTE — ED Notes (Signed)
 Pt taken for XRAY prior to being able to obtain EKG, to lobby to obtain EKG, but pt not answering when called. Per staff, pt was talking about leaving the ED.

## 2024-03-14 NOTE — ED Triage Notes (Signed)
 Pt to ED via POV, c/o SHOB x a couple of days. Reports SHOB is worse with laying flat. Reports hx of fluid on lungs.

## 2024-03-15 ENCOUNTER — Emergency Department
Admission: EM | Admit: 2024-03-15 | Discharge: 2024-03-15 | Discharge status: Against Medical Advice | Attending: Emergency Medicine | Admitting: Emergency Medicine

## 2024-03-15 ENCOUNTER — Encounter: Payer: Self-pay | Admitting: Emergency Medicine

## 2024-03-15 ENCOUNTER — Emergency Department

## 2024-03-15 DIAGNOSIS — R0602 Shortness of breath: Secondary | ICD-10-CM | POA: Diagnosis not present

## 2024-03-15 DIAGNOSIS — Z5321 Procedure and treatment not carried out due to patient leaving prior to being seen by health care provider: Secondary | ICD-10-CM | POA: Diagnosis not present

## 2024-03-15 DIAGNOSIS — R079 Chest pain, unspecified: Secondary | ICD-10-CM | POA: Diagnosis not present

## 2024-03-15 LAB — CBC
HCT: 43.4 % (ref 36.0–46.0)
Hemoglobin: 14.1 g/dL (ref 12.0–15.0)
MCH: 33 pg (ref 26.0–34.0)
MCHC: 32.5 g/dL (ref 30.0–36.0)
MCV: 101.6 fL — ABNORMAL HIGH (ref 80.0–100.0)
Platelets: 231 K/uL (ref 150–400)
RBC: 4.27 MIL/uL (ref 3.87–5.11)
RDW: 12.6 % (ref 11.5–15.5)
WBC: 8.4 K/uL (ref 4.0–10.5)
nRBC: 0 % (ref 0.0–0.2)

## 2024-03-15 LAB — BASIC METABOLIC PANEL WITH GFR
Anion gap: 9 (ref 5–15)
BUN: 13 mg/dL (ref 6–20)
CO2: 21 mmol/L — ABNORMAL LOW (ref 22–32)
Calcium: 8.7 mg/dL — ABNORMAL LOW (ref 8.9–10.3)
Chloride: 108 mmol/L (ref 98–111)
Creatinine, Ser: 0.71 mg/dL (ref 0.44–1.00)
GFR, Estimated: >60 mL/min (ref >60)
Glucose, Bld: 90 mg/dL (ref 70–99)
Potassium: 4.1 mmol/L (ref 3.5–5.1)
Sodium: 138 mmol/L (ref 135–145)

## 2024-03-15 LAB — TROPONIN I (HIGH SENSITIVITY): Troponin I (High Sensitivity): 3 ng/L (ref <18)

## 2024-03-15 NOTE — ED Triage Notes (Signed)
 Patient to ED via POV for SOB/CP. States ongoing x3 days. States more tired than normal. Hx of fluid on lungs. Speaking in full sentences without difficulty. LWBS form [facility] last night.

## 2024-03-15 NOTE — ED Notes (Signed)
 Pt name called no response.

## 2024-03-15 NOTE — ED Notes (Signed)
 Pt called for a treatment room, no answer, not visualized in the lobby or outside.

## 2024-03-15 NOTE — ED Notes (Signed)
 Pt called for treatment room x 3. Pt not visualized in the lobby or outside in front of the ED.

## 2024-03-15 NOTE — ED Notes (Signed)
 Pt called for treatment room. No answer. Pt not visualized in the lobby. Pt not visualized outside in front of the ED.
